# Patient Record
Sex: Male | Born: 1955 | Race: White | Hispanic: No | Marital: Married | Smoking: Never smoker
Health system: Southern US, Community
[De-identification: ages and names within clinical notes are randomized; demographics above are authoritative.]

## PROBLEM LIST (undated history)

## (undated) DIAGNOSIS — D649 Anemia, unspecified: Secondary | ICD-10-CM

## (undated) DIAGNOSIS — F419 Anxiety disorder, unspecified: Secondary | ICD-10-CM

## (undated) DIAGNOSIS — G4730 Sleep apnea, unspecified: Secondary | ICD-10-CM

## (undated) DIAGNOSIS — C801 Malignant (primary) neoplasm, unspecified: Secondary | ICD-10-CM

## (undated) DIAGNOSIS — F319 Bipolar disorder, unspecified: Secondary | ICD-10-CM

## (undated) DIAGNOSIS — I10 Essential (primary) hypertension: Secondary | ICD-10-CM

## (undated) DIAGNOSIS — K219 Gastro-esophageal reflux disease without esophagitis: Secondary | ICD-10-CM

## (undated) HISTORY — PX: VASECTOMY: SHX75

---

## 1974-12-17 HISTORY — PX: TYMPANOPLASTY: SHX33

## 2006-12-17 DIAGNOSIS — F319 Bipolar disorder, unspecified: Secondary | ICD-10-CM

## 2006-12-17 HISTORY — DX: Bipolar disorder, unspecified: F31.9

## 2014-04-06 ENCOUNTER — Other Ambulatory Visit: Payer: Self-pay | Admitting: Urology

## 2014-04-06 NOTE — Progress Notes (Signed)
Surgery on 04/19/14.  Need orders in EPIC.  Thank You.

## 2014-04-07 ENCOUNTER — Encounter (HOSPITAL_COMMUNITY): Payer: Self-pay | Admitting: Pharmacy Technician

## 2014-04-08 ENCOUNTER — Encounter (HOSPITAL_COMMUNITY): Payer: Self-pay

## 2014-04-08 ENCOUNTER — Encounter (HOSPITAL_COMMUNITY)
Admission: RE | Admit: 2014-04-08 | Discharge: 2014-04-08 | Disposition: A | Discharge status: Home / Self Care | Payer: BC Managed Care – PPO | Source: Ambulatory Visit | Attending: Urology | Admitting: Urology

## 2014-04-08 ENCOUNTER — Ambulatory Visit (HOSPITAL_COMMUNITY)
Admission: RE | Admit: 2014-04-08 | Discharge: 2014-04-08 | Disposition: A | Discharge status: Home / Self Care | Payer: BC Managed Care – PPO | Source: Ambulatory Visit | Attending: Anesthesiology | Admitting: Anesthesiology

## 2014-04-08 DIAGNOSIS — I10 Essential (primary) hypertension: Secondary | ICD-10-CM | POA: Insufficient documentation

## 2014-04-08 DIAGNOSIS — Z01812 Encounter for preprocedural laboratory examination: Secondary | ICD-10-CM | POA: Insufficient documentation

## 2014-04-08 DIAGNOSIS — Z01818 Encounter for other preprocedural examination: Secondary | ICD-10-CM | POA: Insufficient documentation

## 2014-04-08 HISTORY — DX: Sleep apnea, unspecified: G47.30

## 2014-04-08 HISTORY — DX: Bipolar disorder, unspecified: F31.9

## 2014-04-08 HISTORY — DX: Malignant (primary) neoplasm, unspecified: C80.1

## 2014-04-08 HISTORY — DX: Anemia, unspecified: D64.9

## 2014-04-08 HISTORY — DX: Essential (primary) hypertension: I10

## 2014-04-08 HISTORY — DX: Gastro-esophageal reflux disease without esophagitis: K21.9

## 2014-04-08 HISTORY — DX: Anxiety disorder, unspecified: F41.9

## 2014-04-08 LAB — CBC
HCT: 39.1 % (ref 39.0–52.0)
Hemoglobin: 14.2 g/dL (ref 13.0–17.0)
MCH: 32.4 pg (ref 26.0–34.0)
MCHC: 36.3 g/dL — ABNORMAL HIGH (ref 30.0–36.0)
MCV: 89.3 fL (ref 78.0–100.0)
PLATELETS: 151 10*3/uL (ref 150–400)
RBC: 4.38 MIL/uL (ref 4.22–5.81)
RDW: 12.9 % (ref 11.5–15.5)
WBC: 6.7 10*3/uL (ref 4.0–10.5)

## 2014-04-08 LAB — BASIC METABOLIC PANEL
BUN: 20 mg/dL (ref 6–23)
CO2: 27 mEq/L (ref 19–32)
Calcium: 9.5 mg/dL (ref 8.4–10.5)
Chloride: 105 mEq/L (ref 96–112)
Creatinine, Ser: 1.26 mg/dL (ref 0.50–1.35)
GFR calc Af Amer: 71 mL/min — ABNORMAL LOW (ref >90)
GFR, EST NON AFRICAN AMERICAN: 61 mL/min — AB (ref >90)
Glucose, Bld: 90 mg/dL (ref 70–99)
Potassium: 4.4 mEq/L (ref 3.7–5.3)
SODIUM: 143 meq/L (ref 137–147)

## 2014-04-08 NOTE — Progress Notes (Signed)
PST VISIT-  Instructions regarding morning of surgery arrival, meds morning of surgery, incentive spirometry info and blood transfusion info were printed x 2 copies- patient received one, one in chart.  I later printed sopa/shoer instructions and the previous instructions cannot be viewed in EPIC.  Pt instructed NPO after MN Sunday night-  Take Propranolol, Bupropion, clonazepam, and omeprazole with sip water

## 2014-04-08 NOTE — Patient Instructions (Addendum)
[place] - Preparing for Surgery Before surgery, you can play an important role.  Because skin is not sterile, your skin needs to be as free of germs as possible.  You can reduce the number of germs on your skin by washing with CHG (chlorahexidine gluconate) soap before surgery.  CHG is an antiseptic cleaner which kills germs and bonds with the skin to continue killing germs even after washing. Please DO NOT use if you have an allergy to CHG or antibacterial soaps.  If your skin becomes reddened/irritated stop using the CHG and inform your nurse when you arrive at Short Stay. Do not shave (including legs and underarms) for at least 48 hours prior to the first CHG shower.  You may shave your face. Please follow these instructions carefully:  1.  Shower with CHG Soap the night before surgery and the  morning of Surgery.  2.  If you choose to wash your hair, wash your hair first as usual with your  normal  shampoo.  3.  After you shampoo, rinse your hair and body thoroughly to remove the  shampoo.                           4.  Use CHG as you would any other liquid soap.  You can apply chg directly  to the skin and wash                       Gently with a scrungie or clean washcloth.  5.  Apply the CHG Soap to your body ONLY FROM THE NECK DOWN.   Do not use on open                           Wound or open sores. Avoid contact with eyes, ears mouth and genitals (private parts).                        Genitals (private parts) with your normal soap.             6.  Wash thoroughly, paying special attention to the area where your surgery  will be performed.  7.  Thoroughly rinse your body with warm water from the neck down.  8.  DO NOT shower/wash with your normal soap after using and rinsing off  the CHG Soap.                9.  Pat yourself dry with a clean towel.            10.  Wear clean pajamas.            11.  Place clean sheets on your bed the night of your first shower and do not  sleep with  pets. Day of Surgery : Do not apply any lotions/deodorants the morning of surgery.  Please wear clean clothes to the hospital/surgery center.  FAILURE TO FOLLOW THESE INSTRUCTIONS MAY RESULT IN THE CANCELLATION OF YOUR SURGERY PATIENT SIGNATURE_________________________________  NURSE SIGNATURE__________________________________

## 2014-04-08 NOTE — Progress Notes (Signed)
ekg 3/15 on chart

## 2014-04-16 NOTE — H&P (Signed)
Chief Complaint Prostate cancer   History of Present Illness Philip Garza is a 58 year old gentleman seen today at the request of Dr. Lerry Liner for clinically localized prostate cancer. He initially underwent prostate biopsy approximately 18 months ago when his PSA was approximately 4. This proved to be benign but his PSA continued to rise to 8.1 despite empiric therapy with antibiotics. This prompted a repeat saturation prostate biopsy in the operating room on 03/10/14 which demonstrated Gleason score 4+3 = 7 adenocarcinoma the prostate. 10 out of 28 biopsy cores were positive for malignancy and disease was noted bilaterally. He apparently has undergone staging studies including a bone scan and CT scan recently. Although I do not have the studies or the reports, the patient states that there was some concern raised about his spine on one of the reports. He has no family history of prostate cancer.    His medical comorbidities included history of bipolar disorder, hypertension, depression, anxiety, and gastroesophageal reflux disease. He also has sleep apnea and does utilize CPAP.    TNM stage: cT2a Nx Mx (He does have induration noted toward the left base medially)  PSA: 8.1  Gleason score: 4+3 = 7  Biopsy (03/10/13 - read by Dr. Hetty Ely, [facility], accession number [id number]): 10/28 cores positive -- 40% of the left-sided specimens were involved with tumor with a Gleason score of 4+3 = 7, 5% of the right-sided specimens were positive for malignancy with a Gleason score 3+4 = 7.  Prostate volume: Unknown    Urinary function: He does have some mild urinary urgency and frequency which is not particularly bothersome. IPSS is 6.  Erectile function: He denies erectile dysfunction. SHIM score is 25.     Past Medical History Problems  1. History of Anxiety (300.00) 2. History of Bipolar disorder (296.80) 3. History of depression (V11.8) 4. History of esophageal reflux  (V12.79) 5. History of gout (V12.29) 6. History of hypertension (V12.59) 7. History of Sleep apnea in adult (327.23)  Surgical History Problems  1. History of Tympanic Membrane Repair - Right Ear  Current Meds 1. ClonazePAM 0.5 MG Oral Tablet;  Therapy: (Recorded:21Apr2015) to Recorded 2. Lisinopril 20 MG Oral Tablet;  Therapy: (Recorded:20Apr2015) to Recorded 3. Omeprazole 20 MG Oral Capsule Delayed Release;  Therapy: [id number] to Recorded 4. Propranolol HCl - 40 MG Oral Tablet;  Therapy: [id number] to Recorded 5. RisperiDONE 0.5 MG Oral Tablet;  Therapy: [id number] to Recorded 6. Wellbutrin XL 300 MG Oral Tablet Extended Release 24 Hour (BuPROPion HCl ER (XL));  Therapy: (Recorded:20Apr2015) to Recorded  Allergies Medication  1. No Known Drug Allergies  Family History Problems  1. Family history of lung cancer (V16.1) : Father 2. Family history of Toxic effect of asbestos : Father  Social History Problems    Denied: History of Alcohol use   Married   Never smoker  Review of Systems Genitourinary, constitutional, skin, eye, otolaryngeal, hematologic/lymphatic, cardiovascular, pulmonary, endocrine, musculoskeletal, gastrointestinal, neurological and psychiatric system(s) were reviewed and pertinent findings if present are noted.  Psychiatric: anxiety and depression.    Vitals Vital Signs [Data Includes: Last 1 Day]  Recorded: 21Apr2015 11:03AM  Height: 5 ft 10 in Weight: 210 lb  BMI Calculated: 30.13 BSA Calculated: 2.13 Blood Pressure: 107 / 67 Heart Rate: 53  Physical Exam Constitutional: Well nourished and well developed . No acute distress.  ENT:. The ears and nose are normal in appearance.  Neck: The appearance of the neck is normal and no neck mass is present.  Pulmonary: No respiratory distress, normal respiratory rhythm and effort and clear bilateral breath sounds.  Cardiovascular: Heart rate and rhythm are normal . No peripheral edema.  Abdomen: The  abdomen is soft and nontender. No masses are palpated. No CVA tenderness. No hernias are palpable. No hepatosplenomegaly noted.  Rectal: Rectal exam demonstrates normal sphincter tone, no tenderness and no masses. Prostate size is estimated to be 40 g. He does have induration noted toward the left medial base of the prostate. The prostate is not tender. The left seminal vesicle is nonpalpable. The right seminal vesicle is nonpalpable. The perineum is normal on inspection.  Lymphatics: The femoral and inguinal nodes are not enlarged or tender.  Skin: Normal skin turgor, no visible rash and no visible skin lesions.  Neuro/Psych:. Mood and affect are appropriate.    Results/Data Urine [Data Includes: Last 1 Day]   21Apr2015  COLOR YELLOW   APPEARANCE CLEAR   SPECIFIC GRAVITY 1.020   pH 5.5   GLUCOSE NEG mg/dL  BILIRUBIN NEG   KETONE NEG mg/dL  BLOOD NEG   PROTEIN NEG mg/dL  UROBILINOGEN 0.2 mg/dL  NITRITE NEG   LEUKOCYTE ESTERASE NEG    I have reviewed his medical records, PSA results, and pathology report. Findings are as dictated above.   Assessment Assessed  1. Prostate cancer (185) 2. History of Bipolar disorder (296.80)  Plan Health Maintenance  1. UA With REFLEX; [Do Not Release]; Status:Complete;   Done: 21Apr2015 10:48AM Prostate cancer  2. Follow-up Schedule Surgery Office  Follow-up  Status: Complete  Done: 21Apr2015 3. PT/OT Referral Referral  Referral  Status: Hold For - PreCert,Date of Service,Physical  Therapy  Requested for: 23Apr2015  Discussion/Summary 1. Prostate cancer: I have had a detailed discussion with Mr. Philip Garza and his wife today. He is a little bit disappointed that surgery will be a bigger deal when he initially expected. However, he does adamantly wish to proceed with surgical therapy despite me offering him the opportunity for a radiation oncology consultation.   The patient was counseled about the natural history of prostate cancer and the standard  treatment options that are available for prostate cancer. It was explained to him how his age and life expectancy, clinical stage, Gleason score, and PSA affect his prognosis, the decision to proceed with additional staging studies, as well as how that information influences recommended treatment strategies. We discussed the roles for active surveillance, radiation therapy, surgical therapy, androgen deprivation, as well as ablative therapy options for the treatment of prostate cancer as appropriate to his individual cancer situation. We discussed the risks and benefits of these options with regard to their impact on cancer control and also in terms of potential adverse events, complications, and impact on quiality of life particularly related to urinary, bowel, and sexual function. The patient was encouraged to ask questions throughout the discussion today and all questions were answered to his stated satisfaction. In addition, the patient was provided with and/or directed to appropriate resources and literature for further education about prostate cancer and treatment options.   We discussed surgical therapy for prostate cancer including the different available surgical approaches. We discussed, in detail, the risks and expectations of surgery with regard to cancer control, urinary control, and erectile function as well as the expected postoperative recovery process. Additional risks of surgery including but not limited to bleeding, infection, hernia formation, nerve damage, lymphocele formation, bowel/rectal injury potentially necessitating colostomy, damage to the urinary tract resulting in urine leakage, urethral stricture, and the  cardiopulmonary risks such as myocardial infarction, stroke, death, venothromboembolism, etc. were explained. The risk of open surgical conversion for robotic/laparoscopic prostatectomy was also discussed.     I will plan to obtain his recent bone scan and CT scan for  evaluation. I also plan to obtain his biopsy operative note to determine his prostate volume.    He will be scheduled for a bilateral nerve sparing robotic-assisted laparoscopic radical prostatectomy and bilateral pelvic lymphadenectomy. Special attention will be paid to the bladder neck area considering the indurated base toward the bladder neck.     Cc: Dr. Lonia Mad  Dr. Alain Marion    SignaturesElectronically signed by : Raynelle Bring, M.D.; Apr 06 2014  4:45PM EST

## 2014-04-18 NOTE — Anesthesia Preprocedure Evaluation (Signed)
Anesthesia Evaluation  Patient identified by MRN, date of birth, ID band Patient awake    Reviewed: Allergy & Precautions, H&P , NPO status , Patient's Chart, lab work & pertinent test results  Airway Mallampati: II TM Distance: >3 FB Neck ROM: Full    Dental  (+) Dental Advisory Given   Pulmonary sleep apnea ,  breath sounds clear to auscultation        Cardiovascular hypertension, Pt. on medications and Pt. on home beta blockers Rhythm:Regular Rate:Normal     Neuro/Psych PSYCHIATRIC DISORDERS Anxiety Bipolar Disorder negative neurological ROS     GI/Hepatic Neg liver ROS, GERD-  Medicated,  Endo/Other  negative endocrine ROS  Renal/GU negative Renal ROS     Musculoskeletal negative musculoskeletal ROS (+)   Abdominal   Peds  Hematology  (+) Blood dyscrasia, anemia ,   Anesthesia Other Findings   Reproductive/Obstetrics                           Anesthesia Physical Anesthesia Plan  ASA: II  Anesthesia Plan: General   Post-op Pain Management:    Induction: Intravenous  Airway Management Planned: Oral ETT  Additional Equipment:   Intra-op Plan:   Post-operative Plan: Extubation in OR  Informed Consent: I have reviewed the patients History and Physical, chart, labs and discussed the procedure including the risks, benefits and alternatives for the proposed anesthesia with the patient or authorized representative who has indicated his/her understanding and acceptance.   Dental advisory given  Plan Discussed with: CRNA  Anesthesia Plan Comments:         Anesthesia Quick Evaluation

## 2014-04-19 ENCOUNTER — Encounter (HOSPITAL_COMMUNITY)
Admission: RE | Disposition: A | Discharge status: Home / Self Care | Payer: Self-pay | Source: Ambulatory Visit | Attending: Urology

## 2014-04-19 ENCOUNTER — Inpatient Hospital Stay (HOSPITAL_COMMUNITY)
Admission: RE | Admit: 2014-04-19 | Discharge: 2014-04-20 | DRG: 708 | Disposition: A | Discharge status: Home / Self Care | Payer: BC Managed Care – PPO | Source: Ambulatory Visit | Attending: Urology | Admitting: Urology

## 2014-04-19 ENCOUNTER — Inpatient Hospital Stay (HOSPITAL_COMMUNITY): Payer: BC Managed Care – PPO | Admitting: Anesthesiology

## 2014-04-19 ENCOUNTER — Encounter (HOSPITAL_COMMUNITY): Payer: Self-pay | Admitting: *Deleted

## 2014-04-19 ENCOUNTER — Encounter (HOSPITAL_COMMUNITY): Payer: BC Managed Care – PPO | Admitting: Anesthesiology

## 2014-04-19 DIAGNOSIS — Z79899 Other long term (current) drug therapy: Secondary | ICD-10-CM

## 2014-04-19 DIAGNOSIS — R3915 Urgency of urination: Secondary | ICD-10-CM | POA: Diagnosis present

## 2014-04-19 DIAGNOSIS — I10 Essential (primary) hypertension: Secondary | ICD-10-CM | POA: Diagnosis present

## 2014-04-19 DIAGNOSIS — F411 Generalized anxiety disorder: Secondary | ICD-10-CM | POA: Diagnosis present

## 2014-04-19 DIAGNOSIS — M109 Gout, unspecified: Secondary | ICD-10-CM | POA: Diagnosis present

## 2014-04-19 DIAGNOSIS — K219 Gastro-esophageal reflux disease without esophagitis: Secondary | ICD-10-CM | POA: Diagnosis present

## 2014-04-19 DIAGNOSIS — R350 Frequency of micturition: Secondary | ICD-10-CM | POA: Diagnosis present

## 2014-04-19 DIAGNOSIS — C61 Malignant neoplasm of prostate: Principal | ICD-10-CM | POA: Diagnosis present

## 2014-04-19 DIAGNOSIS — G4730 Sleep apnea, unspecified: Secondary | ICD-10-CM | POA: Diagnosis present

## 2014-04-19 DIAGNOSIS — F319 Bipolar disorder, unspecified: Secondary | ICD-10-CM | POA: Diagnosis present

## 2014-04-19 DIAGNOSIS — Z801 Family history of malignant neoplasm of trachea, bronchus and lung: Secondary | ICD-10-CM

## 2014-04-19 HISTORY — PX: ROBOT ASSISTED LAPAROSCOPIC RADICAL PROSTATECTOMY: SHX5141

## 2014-04-19 HISTORY — PX: LYMPHADENECTOMY: SHX5960

## 2014-04-19 LAB — TYPE AND SCREEN
ABO/RH(D): O NEG
Antibody Screen: NEGATIVE

## 2014-04-19 LAB — BASIC METABOLIC PANEL
BUN: 17 mg/dL (ref 6–23)
CALCIUM: 7.7 mg/dL — AB (ref 8.4–10.5)
CO2: 26 meq/L (ref 19–32)
Chloride: 105 mEq/L (ref 96–112)
Creatinine, Ser: 1.56 mg/dL — ABNORMAL HIGH (ref 0.50–1.35)
GFR calc Af Amer: 55 mL/min — ABNORMAL LOW (ref >90)
GFR calc non Af Amer: 47 mL/min — ABNORMAL LOW (ref >90)
Glucose, Bld: 226 mg/dL — ABNORMAL HIGH (ref 70–99)
POTASSIUM: 5.3 meq/L (ref 3.7–5.3)
SODIUM: 139 meq/L (ref 137–147)

## 2014-04-19 LAB — HEMOGLOBIN AND HEMATOCRIT, BLOOD
HCT: 40.2 % (ref 39.0–52.0)
Hemoglobin: 14.1 g/dL (ref 13.0–17.0)

## 2014-04-19 LAB — ABO/RH: ABO/RH(D): O NEG

## 2014-04-19 SURGERY — ROBOTIC ASSISTED LAPAROSCOPIC RADICAL PROSTATECTOMY LEVEL 2
Anesthesia: General

## 2014-04-19 MED ORDER — LACTATED RINGERS IV SOLN
INTRAVENOUS | Status: DC | PRN
  Administered 2014-04-19 (×3): via INTRAVENOUS

## 2014-04-19 MED ORDER — PROPOFOL 10 MG/ML IV BOLUS
INTRAVENOUS | Status: DC | PRN
  Administered 2014-04-19: 180 mg via INTRAVENOUS

## 2014-04-19 MED ORDER — SUFENTANIL CITRATE 50 MCG/ML IV SOLN
INTRAVENOUS | Status: AC
Start: 2014-04-19 — End: 2014-04-19
  Filled 2014-04-19: qty 1

## 2014-04-19 MED ORDER — MIDAZOLAM HCL 5 MG/5ML IJ SOLN
INTRAMUSCULAR | Status: DC | PRN
  Administered 2014-04-19: 2 mg via INTRAVENOUS

## 2014-04-19 MED ORDER — PROMETHAZINE HCL 25 MG/ML IJ SOLN
INTRAMUSCULAR | Status: AC
  Filled 2014-04-19: qty 1

## 2014-04-19 MED ORDER — CEFAZOLIN SODIUM 1-5 GM-% IV SOLN
1.0000 g | Freq: Three times a day (TID) | INTRAVENOUS | Status: AC
  Administered 2014-04-19 (×2): 1 g via INTRAVENOUS
  Filled 2014-04-19 (×2): qty 50

## 2014-04-19 MED ORDER — DOCUSATE SODIUM 100 MG PO CAPS
100.0000 mg | ORAL_CAPSULE | Freq: Two times a day (BID) | ORAL | Status: DC
  Administered 2014-04-19 – 2014-04-20 (×2): 100 mg via ORAL
  Filled 2014-04-19 (×3): qty 1

## 2014-04-19 MED ORDER — PROMETHAZINE HCL 25 MG/ML IJ SOLN
6.2500 mg | INTRAMUSCULAR | Status: DC | PRN
  Administered 2014-04-19: 6.25 mg via INTRAVENOUS

## 2014-04-19 MED ORDER — NEOSTIGMINE METHYLSULFATE 10 MG/10ML IV SOLN
INTRAVENOUS | Status: DC | PRN
  Administered 2014-04-19: 4 mg via INTRAVENOUS

## 2014-04-19 MED ORDER — HYDROMORPHONE HCL PF 2 MG/ML IJ SOLN
INTRAMUSCULAR | Status: AC
  Filled 2014-04-19: qty 1

## 2014-04-19 MED ORDER — DIPHENHYDRAMINE HCL 50 MG/ML IJ SOLN: 12.5000 mg | Freq: Four times a day (QID) | INTRAMUSCULAR | Status: DC | PRN

## 2014-04-19 MED ORDER — MIDAZOLAM HCL 2 MG/2ML IJ SOLN
INTRAMUSCULAR | Status: AC
  Filled 2014-04-19: qty 2

## 2014-04-19 MED ORDER — GLYCOPYRROLATE 0.2 MG/ML IJ SOLN
INTRAMUSCULAR | Status: DC | PRN
  Administered 2014-04-19: 0.6 mg via INTRAVENOUS

## 2014-04-19 MED ORDER — EPHEDRINE SULFATE 50 MG/ML IJ SOLN
INTRAMUSCULAR | Status: DC | PRN
  Administered 2014-04-19: 5 mg via INTRAVENOUS

## 2014-04-19 MED ORDER — PROPOFOL 10 MG/ML IV BOLUS
INTRAVENOUS | Status: AC
  Filled 2014-04-19: qty 20

## 2014-04-19 MED ORDER — CISATRACURIUM BESYLATE 20 MG/10ML IV SOLN
INTRAVENOUS | Status: AC
  Filled 2014-04-19: qty 10

## 2014-04-19 MED ORDER — PROPRANOLOL HCL 80 MG PO TABS
80.0000 mg | ORAL_TABLET | Freq: Two times a day (BID) | ORAL | Status: DC
  Filled 2014-04-19: qty 1

## 2014-04-19 MED ORDER — DIPHENHYDRAMINE HCL 12.5 MG/5ML PO ELIX: 12.5000 mg | ORAL_SOLUTION | Freq: Four times a day (QID) | ORAL | Status: DC | PRN

## 2014-04-19 MED ORDER — LACTATED RINGERS IV SOLN
INTRAVENOUS | Status: DC | PRN
  Administered 2014-04-19: 08:00:00

## 2014-04-19 MED ORDER — GLYCOPYRROLATE 0.2 MG/ML IJ SOLN
INTRAMUSCULAR | Status: AC
  Filled 2014-04-19: qty 3

## 2014-04-19 MED ORDER — SUFENTANIL CITRATE 50 MCG/ML IV SOLN
INTRAVENOUS | Status: DC | PRN
  Administered 2014-04-19: 5 ug via INTRAVENOUS
  Administered 2014-04-19: 20 ug via INTRAVENOUS

## 2014-04-19 MED ORDER — HYDROMORPHONE HCL PF 1 MG/ML IJ SOLN
INTRAMUSCULAR | Status: DC | PRN
  Administered 2014-04-19 (×3): .4 mg via INTRAVENOUS

## 2014-04-19 MED ORDER — SODIUM CHLORIDE 0.9 % IV BOLUS (SEPSIS)
1000.0000 mL | Freq: Once | INTRAVENOUS | Status: AC
  Administered 2014-04-19: 1000 mL via INTRAVENOUS

## 2014-04-19 MED ORDER — SODIUM CHLORIDE 0.9 % IR SOLN
Status: DC | PRN
  Administered 2014-04-19: 1000 mL

## 2014-04-19 MED ORDER — BUPIVACAINE-EPINEPHRINE (PF) 0.25% -1:200000 IJ SOLN
INTRAMUSCULAR | Status: AC
  Filled 2014-04-19: qty 30

## 2014-04-19 MED ORDER — BUPROPION HCL ER (XL) 300 MG PO TB24
300.0000 mg | ORAL_TABLET | Freq: Every morning | ORAL | Status: DC
  Administered 2014-04-20: 300 mg via ORAL
  Filled 2014-04-19: qty 1

## 2014-04-19 MED ORDER — HYDROMORPHONE HCL PF 1 MG/ML IJ SOLN
0.2500 mg | INTRAMUSCULAR | Status: DC | PRN
  Administered 2014-04-19 (×3): 0.5 mg via INTRAVENOUS

## 2014-04-19 MED ORDER — OXYCODONE HCL 5 MG PO TABS: 5.0000 mg | ORAL_TABLET | Freq: Once | ORAL | Status: DC | PRN

## 2014-04-19 MED ORDER — EPHEDRINE SULFATE 50 MG/ML IJ SOLN
INTRAMUSCULAR | Status: AC
  Filled 2014-04-19: qty 1

## 2014-04-19 MED ORDER — BUPIVACAINE-EPINEPHRINE 0.25% -1:200000 IJ SOLN
INTRAMUSCULAR | Status: DC | PRN
Start: 2014-04-19 — End: 2014-04-19
  Administered 2014-04-19: 30 mL

## 2014-04-19 MED ORDER — OXYCODONE HCL 5 MG/5ML PO SOLN
5.0000 mg | Freq: Once | ORAL | Status: DC | PRN
  Filled 2014-04-19: qty 5

## 2014-04-19 MED ORDER — SUCCINYLCHOLINE CHLORIDE 20 MG/ML IJ SOLN
INTRAMUSCULAR | Status: DC | PRN
  Administered 2014-04-19: 100 mg via INTRAVENOUS

## 2014-04-19 MED ORDER — LIDOCAINE HCL (CARDIAC) 20 MG/ML IV SOLN
INTRAVENOUS | Status: AC
  Filled 2014-04-19: qty 5

## 2014-04-19 MED ORDER — ACETAMINOPHEN 325 MG PO TABS: 650.0000 mg | ORAL_TABLET | ORAL | Status: DC | PRN

## 2014-04-19 MED ORDER — PANTOPRAZOLE SODIUM 40 MG PO TBEC
40.0000 mg | DELAYED_RELEASE_TABLET | Freq: Every day | ORAL | Status: DC
  Administered 2014-04-20: 40 mg via ORAL
  Filled 2014-04-19: qty 1

## 2014-04-19 MED ORDER — KCL IN DEXTROSE-NACL 20-5-0.45 MEQ/L-%-% IV SOLN
INTRAVENOUS | Status: DC
  Administered 2014-04-19: 12:00:00 via INTRAVENOUS
  Administered 2014-04-19: 150 mL/h via INTRAVENOUS
  Administered 2014-04-20: 02:00:00 via INTRAVENOUS
  Filled 2014-04-19 (×4): qty 1000

## 2014-04-19 MED ORDER — HYDROMORPHONE HCL PF 1 MG/ML IJ SOLN
INTRAMUSCULAR | Status: AC
  Filled 2014-04-19: qty 1

## 2014-04-19 MED ORDER — MEPERIDINE HCL 50 MG/ML IJ SOLN: 6.2500 mg | INTRAMUSCULAR | Status: DC | PRN

## 2014-04-19 MED ORDER — MORPHINE SULFATE 10 MG/ML IJ SOLN: 2.0000 mg | INTRAMUSCULAR | Status: DC | PRN

## 2014-04-19 MED ORDER — DEXAMETHASONE SODIUM PHOSPHATE 10 MG/ML IJ SOLN
INTRAMUSCULAR | Status: AC
  Filled 2014-04-19: qty 1

## 2014-04-19 MED ORDER — LIDOCAINE HCL (CARDIAC) 20 MG/ML IV SOLN
INTRAVENOUS | Status: DC | PRN
  Administered 2014-04-19: 100 mg via INTRAVENOUS

## 2014-04-19 MED ORDER — KETOROLAC TROMETHAMINE 15 MG/ML IJ SOLN
15.0000 mg | Freq: Four times a day (QID) | INTRAMUSCULAR | Status: DC
  Administered 2014-04-19 – 2014-04-20 (×4): 15 mg via INTRAVENOUS
  Filled 2014-04-19 (×5): qty 1

## 2014-04-19 MED ORDER — HEPARIN SODIUM (PORCINE) 1000 UNIT/ML IJ SOLN
INTRAMUSCULAR | Status: AC
  Filled 2014-04-19: qty 1

## 2014-04-19 MED ORDER — KCL IN DEXTROSE-NACL 20-5-0.45 MEQ/L-%-% IV SOLN
INTRAVENOUS | Status: AC
  Filled 2014-04-19: qty 1000

## 2014-04-19 MED ORDER — HYDROCODONE-ACETAMINOPHEN 5-325 MG PO TABS: 1.0000 | ORAL_TABLET | Freq: Four times a day (QID) | ORAL | Status: DC | PRN

## 2014-04-19 MED ORDER — ONDANSETRON HCL 4 MG/2ML IJ SOLN
INTRAMUSCULAR | Status: AC
  Filled 2014-04-19: qty 2

## 2014-04-19 MED ORDER — ONDANSETRON HCL 4 MG/2ML IJ SOLN
INTRAMUSCULAR | Status: DC | PRN
  Administered 2014-04-19: 4 mg via INTRAVENOUS

## 2014-04-19 MED ORDER — CIPROFLOXACIN HCL 500 MG PO TABS: 500.0000 mg | ORAL_TABLET | Freq: Two times a day (BID) | ORAL | Status: DC

## 2014-04-19 MED ORDER — DEXAMETHASONE SODIUM PHOSPHATE 10 MG/ML IJ SOLN
INTRAMUSCULAR | Status: DC | PRN
  Administered 2014-04-19: 10 mg via INTRAVENOUS

## 2014-04-19 MED ORDER — ATROPINE SULFATE 0.4 MG/ML IJ SOLN
INTRAMUSCULAR | Status: AC
  Filled 2014-04-19: qty 1

## 2014-04-19 MED ORDER — RISPERIDONE 1 MG PO TABS
1.0000 mg | ORAL_TABLET | Freq: Every day | ORAL | Status: DC
  Administered 2014-04-19: 1 mg via ORAL
  Filled 2014-04-19 (×2): qty 1

## 2014-04-19 MED ORDER — SODIUM CHLORIDE 0.9 % IJ SOLN
INTRAMUSCULAR | Status: AC
  Filled 2014-04-19: qty 10

## 2014-04-19 MED ORDER — CISATRACURIUM BESYLATE (PF) 10 MG/5ML IV SOLN
INTRAVENOUS | Status: DC | PRN
  Administered 2014-04-19: 4 mg via INTRAVENOUS
  Administered 2014-04-19: 10 mg via INTRAVENOUS
  Administered 2014-04-19: 2 mg via INTRAVENOUS

## 2014-04-19 MED ORDER — CEFAZOLIN SODIUM-DEXTROSE 2-3 GM-% IV SOLR
INTRAVENOUS | Status: AC
Start: 2014-04-19 — End: 2014-04-19
  Filled 2014-04-19: qty 50

## 2014-04-19 MED ORDER — KETOROLAC TROMETHAMINE 15 MG/ML IJ SOLN
INTRAMUSCULAR | Status: AC
  Filled 2014-04-19: qty 1

## 2014-04-19 MED ORDER — SODIUM CHLORIDE 0.9 % IV BOLUS (SEPSIS)
500.0000 mL | Freq: Once | INTRAVENOUS | Status: AC
  Administered 2014-04-19: 13:00:00 via INTRAVENOUS

## 2014-04-19 MED ORDER — LISINOPRIL 20 MG PO TABS
20.0000 mg | ORAL_TABLET | Freq: Every morning | ORAL | Status: DC
  Administered 2014-04-19 – 2014-04-20 (×2): 20 mg via ORAL
  Filled 2014-04-19 (×2): qty 1

## 2014-04-19 MED ORDER — CLONAZEPAM 0.5 MG PO TABS
0.5000 mg | ORAL_TABLET | Freq: Every morning | ORAL | Status: DC
  Administered 2014-04-20: 0.5 mg via ORAL
  Filled 2014-04-19: qty 1

## 2014-04-19 MED ORDER — CEFAZOLIN SODIUM-DEXTROSE 2-3 GM-% IV SOLR
2.0000 g | INTRAVENOUS | Status: AC
  Administered 2014-04-19: 2 g via INTRAVENOUS

## 2014-04-19 MED ORDER — PROPRANOLOL HCL 40 MG PO TABS
80.0000 mg | ORAL_TABLET | Freq: Two times a day (BID) | ORAL | Status: DC
  Administered 2014-04-19 – 2014-04-20 (×2): 80 mg via ORAL
  Filled 2014-04-19 (×3): qty 2

## 2014-04-19 SURGICAL SUPPLY — 44 items
CABLE HIGH FREQUENCY MONO STRZ (ELECTRODE) ×4 IMPLANT
CATH FOLEY 2WAY SLVR 18FR 30CC (CATHETERS) ×4 IMPLANT
CATH ROBINSON RED A/P 16FR (CATHETERS) ×4 IMPLANT
CATH ROBINSON RED A/P 8FR (CATHETERS) ×4 IMPLANT
CATH TIEMANN FOLEY 18FR 5CC (CATHETERS) ×4 IMPLANT
CHLORAPREP W/TINT 26ML (MISCELLANEOUS) ×4 IMPLANT
CLIP LIGATING HEM O LOK PURPLE (MISCELLANEOUS) ×12 IMPLANT
CLOTH BEACON ORANGE TIMEOUT ST (SAFETY) ×4 IMPLANT
COVER SURGICAL LIGHT HANDLE (MISCELLANEOUS) ×4 IMPLANT
COVER TIP SHEARS 8 DVNC (MISCELLANEOUS) ×2 IMPLANT
COVER TIP SHEARS 8MM DA VINCI (MISCELLANEOUS) ×2
CUTTER ECHEON FLEX ENDO 45 340 (ENDOMECHANICALS) ×4 IMPLANT
DECANTER SPIKE VIAL GLASS SM (MISCELLANEOUS) IMPLANT
DERMABOND ADVANCED (GAUZE/BANDAGES/DRESSINGS)
DERMABOND ADVANCED .7 DNX12 (GAUZE/BANDAGES/DRESSINGS) IMPLANT
DRAPE SURG IRRIG POUCH 19X23 (DRAPES) ×4 IMPLANT
DRSG TEGADERM 4X4.75 (GAUZE/BANDAGES/DRESSINGS) ×4 IMPLANT
DRSG TEGADERM 6X8 (GAUZE/BANDAGES/DRESSINGS) ×8 IMPLANT
ELECT REM PT RETURN 9FT ADLT (ELECTROSURGICAL) ×4
ELECTRODE REM PT RTRN 9FT ADLT (ELECTROSURGICAL) ×2 IMPLANT
GLOVE BIO SURGEON STRL SZ 6.5 (GLOVE) ×3 IMPLANT
GLOVE BIO SURGEONS STRL SZ 6.5 (GLOVE) ×1
GLOVE BIOGEL M STRL SZ7.5 (GLOVE) ×36 IMPLANT
GOWN STRL REUS W/TWL LRG LVL3 (GOWN DISPOSABLE) ×20 IMPLANT
HOLDER FOLEY CATH W/STRAP (MISCELLANEOUS) ×4 IMPLANT
IV LACTATED RINGERS 1000ML (IV SOLUTION) ×4 IMPLANT
KIT ACCESSORY DA VINCI DISP (KITS) ×2
KIT ACCESSORY DVNC DISP (KITS) ×2 IMPLANT
MANIFOLD NEPTUNE II (INSTRUMENTS) ×4 IMPLANT
NDL SAFETY ECLIPSE 18X1.5 (NEEDLE) ×2 IMPLANT
NEEDLE HYPO 18GX1.5 SHARP (NEEDLE) ×2
PACK ROBOT UROLOGY CUSTOM (CUSTOM PROCEDURE TRAY) ×4 IMPLANT
RELOAD GREEN ECHELON 45 (STAPLE) ×4 IMPLANT
SET TUBE IRRIG SUCTION NO TIP (IRRIGATION / IRRIGATOR) ×4 IMPLANT
SOLUTION ELECTROLUBE (MISCELLANEOUS) ×4 IMPLANT
SUT ETHILON 3 0 PS 1 (SUTURE) ×4 IMPLANT
SUT MNCRL 3 0 RB1 (SUTURE) ×2 IMPLANT
SUT MNCRL AB 4-0 PS2 18 (SUTURE) ×8 IMPLANT
SUT MONOCRYL 3 0 RB1 (SUTURE) ×2
SUT VICRYL 0 UR6 27IN ABS (SUTURE) ×8 IMPLANT
SYR 27GX1/2 1ML LL SAFETY (SYRINGE) ×4 IMPLANT
TOWEL OR 17X26 10 PK STRL BLUE (TOWEL DISPOSABLE) ×4 IMPLANT
TOWEL OR NON WOVEN STRL DISP B (DISPOSABLE) ×4 IMPLANT
WATER STERILE IRR 1500ML POUR (IV SOLUTION) ×8 IMPLANT

## 2014-04-19 NOTE — Anesthesia Procedure Notes (Addendum)
Procedure Name: Intubation Date/Time: 04/19/2014 7:27 AM Performed by: Sharlette Dense Pre-anesthesia Checklist: Patient identified Patient Re-evaluated:Patient Re-evaluated prior to inductionOxygen Delivery Method: Circle system utilized Preoxygenation: Pre-oxygenation with 100% oxygen Intubation Type: IV induction Ventilation: Mask ventilation without difficulty and Oral airway inserted - appropriate to patient size Laryngoscope Size: Mac and 3 Grade View: Grade I Tube type: Oral Tube size: 8.0 mm Number of attempts: 1 Airway Equipment and Method: Stylet and Oral airway Placement Confirmation: ETT inserted through vocal cords under direct vision,  positive ETCO2 and breath sounds checked- equal and bilateral Secured at: 24 cm Tube secured with: Tape Dental Injury: Teeth and Oropharynx as per pre-operative assessment  Comments: Intubation performed by [facility]

## 2014-04-19 NOTE — Discharge Instructions (Signed)
1. Activity:  You are encouraged to ambulate frequently (about every hour during waking hours) to help prevent blood clots from forming in your legs or lungs.  However, you should not engage in any heavy lifting (> 10-15 lbs), strenuous activity, or straining. 2. Diet: You should continue a clear liquid diet until passing gas from below.  Once this occurs, you may advance your diet to a soft diet that would be easy to digest (i.e soups, scrambled eggs, mashed potatoes, etc.) for 24 hours just as you would if getting over a bad stomach flu.  If tolerating this diet well for 24 hours, you may then begin eating regular food.  It will be normal to have some amount of bloating, nausea, and abdominal discomfort intermittently. 3. Prescriptions:  You will be provided a prescription for pain medication to take as needed.  If your pain is not severe enough to require the prescription pain medication, you may take extra strength Tylenol instead.  You should also take an over the counter stool softener (Colace 100 mg twice daily) to avoid straining with bowel movements as the pain medication may constipate you. Finally, you will also be provided a prescription for an antibiotic to begin the day prior to your return visit in the office for catheter removal. 4. Catheter care: You will be taught how to take care of the catheter by the nursing staff prior to discharge from the hospital.  You may use both a leg bag and the larger bedside bag but it is recommended to at least use the bigger bedside bag at nighttime as the leg bag is small and will fill up overnight and also does not drain as well when lying flat. You may periodically feel a strong urge to void with the catheter in place.  This is a bladder spasm and most often can occur when having a bowel movement or when you are moving around. It is typically self-limited and usually will stop after a few minutes.  You may use some Vaseline or Neosporin around the tip of the  catheter to reduce friction at the tip of the penis. 5. Incisions: You may remove your dressing bandages the 2nd day after surgery.  You most likely will have a few small staples in each of the incisions and once the bandages are removed, the incisions may stay open to air.  You may start showering (not soaking or bathing in water) 48 hours after surgery and the incisions simply need to be patted dry after the shower.  No additional care is needed. 6. What to call us about: You should call the office [phone]) if you develop fever > 101, persistent vomiting, or the catheter stops draining. Also, feel free to call with any other questions you may have and remember the handout that was provided to you as a reference preoperatively which answers many of the common questions that arise after surgery.  You may resume aspirin, vitamins, aleve, advil, and supplements 7 days after surgery.

## 2014-04-19 NOTE — Interval H&P Note (Signed)
History and Physical Interval Note:  04/19/2014 7:10 AM  Philip Garza  has presented today for surgery, with the diagnosis of PROSTATE CANCER  The various methods of treatment have been discussed with the patient and family. After consideration of risks, benefits and other options for treatment, the patient has consented to  Procedure(s): ROBOTIC ASSISTED LAPAROSCOPIC RADICAL PROSTATECTOMY LEVEL 2 (N/A) LYMPHADENECTOMY (Bilateral) as a surgical intervention .  The patient's history has been reviewed, patient examined, no change in status, stable for surgery.  I have reviewed the patient's chart and labs.  Questions were answered to the patient's satisfaction.     [employer]

## 2014-04-19 NOTE — Transfer of Care (Signed)
Immediate Anesthesia Transfer of Care Note  Patient: Philip Garza  Procedure(s) Performed: Procedure(s): ROBOTIC ASSISTED LAPAROSCOPIC RADICAL PROSTATECTOMY LEVEL 2 (N/A) LYMPHADENECTOMY (Bilateral)  Patient Location: PACU  Anesthesia Type:General  Level of Consciousness: sedated  Airway & Oxygen Therapy: Patient Spontanous Breathing and Patient connected to face mask oxygen  Post-op Assessment: Report given to PACU RN and Post -op Vital signs reviewed and stable  Post vital signs: Reviewed and stable  Complications: No apparent anesthesia complications

## 2014-04-19 NOTE — Progress Notes (Signed)
Pt will place on mask when ready for bed. Pt and pt's family encouraged to call RT if needing any assistance with placing mask on. No distress noted.

## 2014-04-19 NOTE — Op Note (Signed)
Preoperative diagnosis: Clinically localized adenocarcinoma of the prostate (clinical stage T2a N0 M0)  Postoperative diagnosis: Clinically localized adenocarcinoma of the prostate (clinical stage T2a N0 M0)  Procedure:  1. Robotic assisted laparoscopic radical prostatectomy (right nerve sparing) 2. Bilateral robotic assisted laparoscopic pelvic lymphadenectomy  Surgeon: [name]. M.D.  Assistant(s): Leta Baptist, PA-C  Anesthesia: General  Complications: None  EBL: 50 mL  IVF:  2000 mL crystalloid  Specimens: 1. Prostate and seminal vesicles 2. Right pelvic lymph nodes 3. Left pelvic lymph nodes  Disposition of specimens: Pathology  Drains: 1. 20 Fr coude catheter 2. # 19 Blake pelvic drain  Indication: Philip Garza is a 58 y.o. patient with clinically localized prostate cancer.  After a thorough review of the management options for treatment of prostate cancer, he elected to proceed with surgical therapy and the above procedure(s).  We have discussed the potential benefits and risks of the procedure, side effects of the proposed treatment, the likelihood of the patient achieving the goals of the procedure, and any potential problems that might occur during the procedure or recuperation. Informed consent has been obtained.  Description of procedure:  The patient was taken to the operating room and a general anesthetic was administered. He was given preoperative antibiotics, placed in the dorsal lithotomy position, and prepped and draped in the usual sterile fashion. Next a preoperative timeout was performed. A urethral catheter was placed into the bladder and a site was selected near the umbilicus for placement of the camera port. This was placed using a standard open Hassan technique which allowed entry into the peritoneal cavity under direct vision and without difficulty. A 12 mm port was placed and a pneumoperitoneum established. The camera was then used to  inspect the abdomen and there was no evidence of any intra-abdominal injuries or other abnormalities. The remaining abdominal ports were then placed. 8 mm robotic ports were placed in the right lower quadrant, left lower quadrant, and far left lateral abdominal wall. A 5 mm port was placed in the right upper quadrant and a 12 mm port was placed in the right lateral abdominal wall for laparoscopic assistance. All ports were placed under direct vision without difficulty. The surgical cart was then docked.   Utilizing the cautery scissors, the bladder was reflected posteriorly allowing entry into the space of Retzius and identification of the endopelvic fascia and prostate. The periprostatic fat was then removed from the prostate allowing full exposure of the endopelvic fascia. The endopelvic fascia was then incised from the apex back to the base of the prostate bilaterally and the underlying levator muscle fibers were swept laterally off the prostate thereby isolating the dorsal venous complex. The dorsal vein was then stapled and divided with a 45 mm Flex Echelon stapler. Attention then turned to the bladder neck which was divided anteriorly thereby allowing entry into the bladder and exposure of the urethral catheter. The catheter balloon was deflated and the catheter was brought into the operative field and used to retract the prostate anteriorly. The posterior bladder neck was then examined and was divided allowing further dissection between the bladder and prostate posteriorly until the vasa deferentia and seminal vessels were identified. The vasa deferentia were isolated, divided, and lifted anteriorly. The seminal vesicles were dissected down to their tips with care to control the seminal vascular arterial blood supply. These structures were then lifted anteriorly and the space between Denonvillier's fascia and the anterior rectum was developed with a combination of sharp  and blunt dissection. This isolated  the vascular pedicles of the prostate.  The lateral prostatic fascia on the right side of the prostate was then sharply incised allowing release of the neurovascular bundle. The vascular pedicle of the prostate on the right side was then ligated with Weck clips between the prostate and neurovascular bundle and divided with sharp cold scissor dissection resulting in neurovascular bundle preservation. On the left side, a wide non nerve sparing dissection was performed with Weck clips used to ligate the vascular pedicle of the prostate. The neurovascular bundle on the right side was then separated off the apex of the prostate and urethra.  The urethra was then sharply transected allowing the prostate specimen to be disarticulated. The pelvis was copiously irrigated and hemostasis was ensured. There was no evidence for rectal injury.  Attention then turned to the right pelvic sidewall. The fibrofatty tissue between the external iliac vein, confluence of the iliac vessels, hypogastric artery, and Cooper's ligament was dissected free from the pelvic sidewall with care to preserve the obturator nerve. Weck clips were used for lymphostasis and hemostasis. An identical procedure was performed on the contralateral side and the lymphatic packets were removed for permanent pathologic analysis.  Attention then turned to the urethral anastomosis. A 2-0 Vicryl slip knot was placed between Denonvillier's fascia, the posterior bladder neck, and the posterior urethra to reapproximate these structures. A double-armed 3-0 Monocryl suture was then used to perform a 360 running tension-free anastomosis between the bladder neck and urethra. A new urethral catheter was then placed into the bladder and irrigated. There were no blood clots within the bladder and the anastomosis appeared to be watertight. A #19 Blake drain was then brought through the left lateral 8 mm port site and positioned appropriately within the pelvis. It was  secured to the skin with a nylon suture. The surgical cart was then undocked. The right lateral 12 mm port site was closed at the fascial level with a 0 Vicryl suture placed laparoscopically. All remaining ports were then removed under direct vision. The prostate specimen was removed intact within the Endopouch retrieval bag via the periumbilical camera port site. This fascial opening was closed with two running 0 Vicryl sutures. 0.25% Marcaine was then injected into all port sites and all incisions were reapproximated at the skin level with 4-0 Monocryl subcuticular sutures. Dermabond was applied. The patient appeared to tolerate the procedure well and without complications. The patient was able to be extubated and transferred to the recovery unit in satisfactory condition.   [name] MD

## 2014-04-19 NOTE — Progress Notes (Signed)
Patient ID: Philip Garza, male   DOB: [birth year], 58 y.o.   MRN: [id number]  Post-op note  Subjective: The patient is doing well.  No complaints. Catheter was not draining in PACU but is now draining well since catheter was repositioned (ureteral orifices were close to bladder neck).  Objective: Vital signs in last 24 hours: Temp:  [97.6 F (36.4 C)-98.5 F (36.9 C)] 98.5 F (36.9 C) (05/04 1417) Pulse Rate:  [54-70] 61 (05/04 1417) Resp:  [9-24] 16 (05/04 1417) BP: (107-139)/(59-89) 128/77 mmHg (05/04 1417) SpO2:  [91 %-100 %] 98 % (05/04 1417) Weight:  [97.07 kg (214 lb)] 97.07 kg (214 lb) (05/04 1417)  Intake/Output from previous day:   Intake/Output this shift: Total I/O In: 6160 [I.V.:3300; Other:110; IV Piggyback:2750] Out: 46 [Urine:140; Drains:230; Blood:50]  Physical Exam:  General: Alert and oriented. Abdomen: Soft, Nondistended. Incisions: Clean and dry. GU: Urine draining well and clear (about 200 cc in bag)  Lab Results:  Recent Labs  04/19/14 1040  HGB 14.1  HCT 40.2    Assessment/Plan: POD#0   1) Continue to monitor UOP 2) Ambulate, IS   [name]. MD   LOS: 0 days   [name] 04/19/2014, 4:24 PM

## 2014-04-19 NOTE — Anesthesia Postprocedure Evaluation (Signed)
Anesthesia Post Note  Patient: Philip Garza  Procedure(s) Performed: Procedure(s) (LRB): ROBOTIC ASSISTED LAPAROSCOPIC RADICAL PROSTATECTOMY LEVEL 2 (N/A) LYMPHADENECTOMY (Bilateral)  Anesthesia type: General  Patient location: PACU  Post pain: Pain level controlled  Post assessment: Post-op Vital signs reviewed  Last Vitals: BP 116/71  Pulse 59  Temp(Src) 36.6 C (Oral)  Resp 20  SpO2 93%  Post vital signs: Reviewed  Level of consciousness: sedated  Complications: No apparent anesthesia complications

## 2014-04-20 ENCOUNTER — Encounter (HOSPITAL_COMMUNITY): Payer: Self-pay | Admitting: Urology

## 2014-04-20 LAB — BASIC METABOLIC PANEL
BUN: 15 mg/dL (ref 6–23)
CHLORIDE: 107 meq/L (ref 96–112)
CO2: 22 mEq/L (ref 19–32)
CREATININE: 1.45 mg/dL — AB (ref 0.50–1.35)
Calcium: 7.9 mg/dL — ABNORMAL LOW (ref 8.4–10.5)
GFR calc Af Amer: 60 mL/min — ABNORMAL LOW (ref >90)
GFR calc non Af Amer: 52 mL/min — ABNORMAL LOW (ref >90)
Glucose, Bld: 157 mg/dL — ABNORMAL HIGH (ref 70–99)
Potassium: 4.7 mEq/L (ref 3.7–5.3)
Sodium: 140 mEq/L (ref 137–147)

## 2014-04-20 LAB — HEMOGLOBIN AND HEMATOCRIT, BLOOD
HCT: 34.1 % — ABNORMAL LOW (ref 39.0–52.0)
HEMOGLOBIN: 12 g/dL — AB (ref 13.0–17.0)

## 2014-04-20 MED ORDER — HYDROCODONE-ACETAMINOPHEN 5-325 MG PO TABS
1.0000 | ORAL_TABLET | Freq: Four times a day (QID) | ORAL | Status: DC | PRN
  Administered 2014-04-20: 2 via ORAL
  Filled 2014-04-20: qty 2

## 2014-04-20 MED ORDER — BISACODYL 10 MG RE SUPP
10.0000 mg | Freq: Once | RECTAL | Status: AC
  Administered 2014-04-20: 10 mg via RECTAL
  Filled 2014-04-20: qty 1

## 2014-04-20 NOTE — Discharge Summary (Signed)
  Date of admission: 04/19/2014  Date of discharge: 04/20/2014  Admission diagnosis: Prostate Cancer  Discharge diagnosis: Prostate Cancer  History and Physical: For full details, please see admission history and physical. Briefly, Philip Garza is a 58 y.o. gentleman with localized prostate cancer.  After discussing management/treatment options, he elected to proceed with surgical treatment.  Hospital Course: GARREN GREENMAN was taken to the operating room on 04/19/2014 and underwent a robotic assisted laparoscopic radical prostatectomy. He tolerated this procedure well and without complications. Postoperatively, he was able to be transferred to a regular hospital room following recovery from anesthesia.  He was able to begin ambulating the night of surgery. He remained hemodynamically stable overnight.  He had excellent urine output with appropriately minimal output from his pelvic drain and his pelvic drain was removed on POD #1.  He was transitioned to oral pain medication, tolerated a clear liquid diet, and had met all discharge criteria and was able to be discharged home later on POD#1.  Laboratory values:  Recent Labs  04/19/14 1040 04/20/14 0333  HGB 14.1 12.0*  HCT 40.2 34.1*    Disposition: Home  Discharge instruction: He was instructed to be ambulatory but to refrain from heavy lifting, strenuous activity, or driving. He was instructed on urethral catheter care.  Discharge medications:     Medication List    STOP taking these medications       B-complex with vitamin C tablet      TAKE these medications       buPROPion 300 MG 24 hr tablet  Commonly known as:  WELLBUTRIN XL  Take 300 mg by mouth every morning.     ciprofloxacin 500 MG tablet  Commonly known as:  CIPRO  Take 1 tablet (500 mg total) by mouth 2 (two) times daily. Start day prior to office visit for foley removal     clonazePAM 0.5 MG tablet  Commonly known as:  KLONOPIN  Take 0.5 mg by mouth every morning.      ferrous sulfate 325 (65 FE) MG tablet  Take 325 mg by mouth daily with breakfast.     HYDROcodone-acetaminophen 5-325 MG per tablet  Commonly known as:  NORCO  Take 1-2 tablets by mouth every 6 (six) hours as needed.     lisinopril 20 MG tablet  Commonly known as:  PRINIVIL,ZESTRIL  Take 20 mg by mouth every morning.     omeprazole 20 MG capsule  Commonly known as:  PRILOSEC  Take 20 mg by mouth daily.     propranolol 80 MG tablet  Commonly known as:  INDERAL  Take 80 mg by mouth 2 (two) times daily.     risperiDONE 1 MG tablet  Commonly known as:  RISPERDAL  Take 1 mg by mouth at bedtime.        Followup: He will followup in 1 week for catheter removal and to discuss his surgical pathology results.

## 2014-04-20 NOTE — Progress Notes (Signed)
Patient ID: Philip Garza, male   DOB: [birth year], 58 y.o.   MRN: [id number]  1 Day Post-Op Subjective: The patient is doing well.  No nausea or vomiting. Pain is adequately controlled.  Objective: Vital signs in last 24 hours: Temp:  [97.3 F (36.3 C)-98.9 F (37.2 C)] 97.5 F (36.4 C) (05/05 0531) Pulse Rate:  [54-104] 84 (05/05 0221) Resp:  [9-24] 20 (05/05 0531) BP: (103-139)/(59-89) 107/65 mmHg (05/05 0531) SpO2:  [91 %-100 %] 97 % (05/05 0531) Weight:  [97.07 kg (214 lb)] 97.07 kg (214 lb) (05/04 1417)  Intake/Output from previous day: 05/04 0701 - 05/05 0700 In: 9210 [P.O.:600; I.V.:5700; IV Piggyback:2800] Out: 1638 [Urine:4090; Drains:330; Blood:50] Intake/Output this shift:    Physical Exam:  General: Alert and oriented. CV: RRR Lungs: Clear bilaterally. GI: Soft, Nondistended. Incisions: Clean, dry, and intact Urine: Clear Extremities: Nontender, no erythema, no edema.  Lab Results:  Recent Labs  04/19/14 1040 04/20/14 0333  HGB 14.1 12.0*  HCT 40.2 34.1*      Assessment/Plan: POD# 1 s/p robotic prostatectomy.  1) SL IVF 2) Ambulate, Incentive spirometry 3) Transition to oral pain medication 4) Dulcolax suppository 5) D/C pelvic drain 6) Plan for likely discharge later today   [name]. MD   LOS: 1 day   [name] 04/20/2014, 7:10 AM

## 2014-04-20 NOTE — Progress Notes (Signed)
Educated patient and wife about leg bag and foley bag. Both stated that they had attended the urology class recently. Wife and patient were able to verbalize and demonstrate understanding of leg bag and appropriate care. Will discharge patient at this time. Pearline Cables Setzer

## 2014-06-16 ENCOUNTER — Telehealth: Payer: Self-pay | Admitting: Oncology

## 2014-06-16 NOTE — Telephone Encounter (Signed)
S/W PATIENT AND GAVE NP APPT FOR 08/14 @ 10:30 W/DR. SHADAD.  [facility] MA[facility].

## 2014-06-21 ENCOUNTER — Telehealth: Payer: Self-pay | Admitting: Oncology

## 2014-06-21 NOTE — Telephone Encounter (Signed)
C/D 06/21/14 for appt. 07/30/14

## 2014-07-23 ENCOUNTER — Other Ambulatory Visit: Payer: Self-pay | Admitting: Oncology

## 2014-07-23 DIAGNOSIS — C61 Malignant neoplasm of prostate: Secondary | ICD-10-CM

## 2014-07-29 ENCOUNTER — Telehealth: Payer: Self-pay | Admitting: Medical Oncology

## 2014-07-29 NOTE — Telephone Encounter (Signed)
Confirmed tomorrows appt. Patient knows to bring current list of medications. Denies questions.

## 2014-07-30 ENCOUNTER — Encounter (INDEPENDENT_AMBULATORY_CARE_PROVIDER_SITE_OTHER): Payer: Self-pay

## 2014-07-30 ENCOUNTER — Other Ambulatory Visit (HOSPITAL_COMMUNITY): Payer: Self-pay | Admitting: Urology

## 2014-07-30 ENCOUNTER — Other Ambulatory Visit (HOSPITAL_BASED_OUTPATIENT_CLINIC_OR_DEPARTMENT_OTHER): Payer: BC Managed Care – PPO

## 2014-07-30 ENCOUNTER — Ambulatory Visit (HOSPITAL_BASED_OUTPATIENT_CLINIC_OR_DEPARTMENT_OTHER): Payer: BC Managed Care – PPO | Admitting: Oncology

## 2014-07-30 ENCOUNTER — Ambulatory Visit (HOSPITAL_BASED_OUTPATIENT_CLINIC_OR_DEPARTMENT_OTHER): Payer: BC Managed Care – PPO

## 2014-07-30 ENCOUNTER — Encounter: Payer: Self-pay | Admitting: Oncology

## 2014-07-30 VITALS — BP 112/65 | HR 57 | Temp 98.0°F | Resp 18 | Ht 70.0 in | Wt 220.9 lb

## 2014-07-30 DIAGNOSIS — I10 Essential (primary) hypertension: Secondary | ICD-10-CM

## 2014-07-30 DIAGNOSIS — C61 Malignant neoplasm of prostate: Secondary | ICD-10-CM

## 2014-07-30 DIAGNOSIS — M899 Disorder of bone, unspecified: Secondary | ICD-10-CM

## 2014-07-30 DIAGNOSIS — F319 Bipolar disorder, unspecified: Secondary | ICD-10-CM

## 2014-07-30 DIAGNOSIS — M949 Disorder of cartilage, unspecified: Secondary | ICD-10-CM

## 2014-07-30 LAB — CBC WITH DIFFERENTIAL/PLATELET
BASO%: 0.9 % (ref 0.0–2.0)
Basophils Absolute: 0.1 10*3/uL (ref 0.0–0.1)
EOS%: 10 % — ABNORMAL HIGH (ref 0.0–7.0)
Eosinophils Absolute: 0.8 10*3/uL — ABNORMAL HIGH (ref 0.0–0.5)
HCT: 43.2 % (ref 38.4–49.9)
HGB: 14.7 g/dL (ref 13.0–17.1)
LYMPH%: 24.2 % (ref 14.0–49.0)
MCH: 30.5 pg (ref 27.2–33.4)
MCHC: 33.9 g/dL (ref 32.0–36.0)
MCV: 90 fL (ref 79.3–98.0)
MONO#: 0.6 10*3/uL (ref 0.1–0.9)
MONO%: 7.7 % (ref 0.0–14.0)
NEUT#: 4.3 10*3/uL (ref 1.5–6.5)
NEUT%: 57.2 % (ref 39.0–75.0)
Platelets: 168 10*3/uL (ref 140–400)
RBC: 4.8 10*6/uL (ref 4.20–5.82)
RDW: 13.9 % (ref 11.0–14.6)
WBC: 7.5 10*3/uL (ref 4.0–10.3)
lymph#: 1.8 10*3/uL (ref 0.9–3.3)

## 2014-07-30 LAB — COMPREHENSIVE METABOLIC PANEL (CC13)
ALT: 29 U/L (ref 0–55)
ANION GAP: 8 meq/L (ref 3–11)
AST: 24 U/L (ref 5–34)
Albumin: 4 g/dL (ref 3.5–5.0)
Alkaline Phosphatase: 138 U/L (ref 40–150)
BUN: 17.1 mg/dL (ref 7.0–26.0)
CHLORIDE: 109 meq/L (ref 98–109)
CO2: 27 meq/L (ref 22–29)
Calcium: 9.4 mg/dL (ref 8.4–10.4)
Creatinine: 1.4 mg/dL — ABNORMAL HIGH (ref 0.7–1.3)
Glucose: 91 mg/dl (ref 70–140)
POTASSIUM: 4.5 meq/L (ref 3.5–5.1)
Sodium: 144 mEq/L (ref 136–145)
TOTAL PROTEIN: 6.8 g/dL (ref 6.4–8.3)
Total Bilirubin: 0.43 mg/dL (ref 0.20–1.20)

## 2014-07-30 NOTE — Progress Notes (Signed)
Please consult note.

## 2014-07-30 NOTE — Progress Notes (Signed)
Checked in new patient with no financial issues prior to seeing the dr. He has appt card and has not been out of the country.

## 2014-07-30 NOTE — Consult Note (Signed)
Reason for Referral: Prostate cancer.   HPI: Philip Garza is a 58 year old gentleman native of [place] where the majority of his life. He has a past medical history significant for hypertension and bipolar disorder. He was noted to have an increase in his PSA of approximately 4 and subsequently underwent a biopsy 2 years ago. He subsequently had a PSA rise up to 8.1 and underwent a biopsy by Philip Garza which showed prostate cancer Gleason score 4+3 equals 7 with 10/28 lymph nodes were positive for malignancy. He subsequently was evaluated by Philip Garza in his staging workup did not show clear-cut malignancy but did show a focal uptake in the posterior aspect of the L3 vertebral body on a bone scan. CT scan failed to replicate any bony metastasis. He subsequently underwent radical prostatectomy and bilateral lymph node dissection on 04/19/2014. The pathology showed pathological stage T2c N0 prostate cancer with a Gleason score 4+3 equals 7. He had negative surgical margins with 17 lymph nodes sampled none were involved with malignancy. His postoperative PSA continue to be persistently high at 5.89 on 06/11/2014 and most recently in August of 2015 was 7.92. He was evaluated by Philip Garza and was recommended to start hormonal therapy and he wanted in medical oncology opinion.  Clinically, he is asymptomatic from his prostate cancer. He does not report any headaches or blurry vision or double vision. Does report any syncope or seizures. He does not report any fevers or chills or sweats. At that point chest pain or shortness of breath. Does not report any cough or hemoptysis. Present report any nausea or vomiting or abdominal pain. Present for any constipation or diarrhea. He is reporting increased frequency of urination but no dysuria or hematuria. He still has some occasional incontinence. He does not report any skeletal complaints of arthralgias or myalgias. He is not report any back pain but does report shoulder  pain and planning to have surgery in the near future. It is not appropriate lymphadenopathy or petechiae. Rest of his review of systems unremarkable   Past Medical History  Diagnosis Date  . Hypertension   . Sleep apnea   . Anxiety   . Bipolar disorder 2008    paranoia/ hospitalized,  now sees psychiatrist  . GERD (gastroesophageal reflux disease)   . Cancer     prostate  . Anemia   :  Past Surgical History  Procedure Laterality Date  . Tympanoplasty Right 1976  . Vasectomy    . Robot assisted laparoscopic radical prostatectomy N/A 04/19/2014    Procedure: ROBOTIC ASSISTED LAPAROSCOPIC RADICAL PROSTATECTOMY LEVEL 2;  Surgeon: Philip Gray, MD;  Location: [facility];  Service: Urology;  Laterality: N/A;  . Lymphadenectomy Bilateral 04/19/2014    Procedure: LYMPHADENECTOMY;  Surgeon: Philip Gray, MD;  Location: [facility];  Service: Urology;  Laterality: Bilateral;  :  Current outpatient prescriptions:buPROPion (WELLBUTRIN XL) 300 MG 24 hr tablet, Take 300 mg by mouth every morning., Disp: , Rfl: ;  clonazePAM (KLONOPIN) 0.5 MG tablet, Take 0.5 mg by mouth every morning., Disp: , Rfl: ;  ferrous sulfate 325 (65 FE) MG tablet, Take 325 mg by mouth daily with breakfast., Disp: , Rfl: ;  lisinopril (PRINIVIL,ZESTRIL) 20 MG tablet, Take 20 mg by mouth every morning., Disp: , Rfl:  omeprazole (PRILOSEC) 20 MG capsule, Take 20 mg by mouth daily., Disp: , Rfl: ;  propranolol (INDERAL) 80 MG tablet, Take 80 mg by mouth 2 (two) times daily., Disp: , Rfl: ;  risperiDONE (RISPERDAL) 1 MG  tablet, Take 1 mg by mouth at bedtime., Disp: , Rfl: :  No Known Allergies:  No family history on file.:  History   Social History  . Marital Status: Married    Spouse Name: N/A    Number of Children: N/A  . Years of Education: N/A   Occupational History  . Not on file.   Social History Main Topics  . Smoking status: Never Smoker   . Smokeless tobacco: Never Used  . Alcohol Use: No  . Drug Use: No  . Sexual  Activity: Not on file   Other Topics Concern  . Not on file   Social History Narrative  . No narrative on file  :  Pertinent items are noted in HPI.  Exam: ECOG 0 Blood pressure 112/65, pulse 57, temperature 98 F (36.7 C), temperature source Oral, resp. rate 18, height 5\' 10"  (1.778 m), weight 220 lb 14.4 oz (100.2 kg). General appearance: alert and cooperative Head: Normocephalic, without obvious abnormality Throat: lips, mucosa, and tongue normal; teeth and gums normal Neck: no adenopathy Back: symmetric, no curvature. ROM normal. No CVA tenderness. Resp: clear to auscultation bilaterally Cardio: regular rate and rhythm, S1, S2 normal, no murmur, click, rub or gallop GI: soft, non-tender; bowel sounds normal; no masses,  no organomegaly Extremities: extremities normal, atraumatic, no cyanosis or edema Pulses: 2+ and symmetric Skin: Skin color, texture, turgor normal. No rashes or lesions Lymph nodes: Cervical, supraclavicular, and axillary nodes normal.   Recent Labs  07/30/14 1027  WBC 7.5  HGB 14.7  HCT 43.2  PLT 168    Recent Labs  07/30/14 1027  NA 144  K 4.5  CO2 27  GLUCOSE 91  BUN 17.1  CREATININE 1.4*  CALCIUM 9.4     Assessment and Plan:   58 year old gentleman with the following issues:  1. Prostate cancer diagnosed  2015 when he presented with a PSA of 8.1 and a Gleason score 4+3 equals 7. He underwent radical prostatectomy in 04/19/2014 and the pathology revealed a Gleason score 4+3 equals 7 with a pathological staging of T2cN0. His lymph node sampling did not show any evidence of malignancy. His postoperative PSA continue to be persistently high and most recently it was elevated to 7.92. His preoperative staging workup did not reveal any clear-cut malignancy but did have a lesion on the L3 vertebral body that is undetermined. The natural course of this disease was discussed today excessively with the patient and his wife. I explained to him the  persistent rise in his PSA is worrisome for microscopic metastatic disease. I doubt that he has local disease that would benefit from radiation therapy.  Treatment options were discussed today. I agree with Philip Garza the first of this to be station with a CT scan and a bone scan or possible PET scan. If she has measurable disease that is low-volume, then I would recommend androgen depravation. If he has measurable disease that is high-volume multiple bony metastasis or visceral metastasis then I would consider combination of hormonal therapy and chemotherapy.  If his imaging studies did not show any measurable disease and he has biochemical relapse in the options maybe a little bit more complex. I still would suggest that androgen deprivation is necessary at some point. One could argue a period of observation or intermittent androgen deprivation could be a possibility as well.  Risks and benefits of all these approaches were discussed including complications related to androgen deprivation as well as systemic chemotherapy. All the  questions were answered to their satisfaction.  2. Followup: Will be as needed depending on the results of the imaging studies and whether he wants to have any further discussion regarding his overall prostate cancer care.  50 minutes spent with the patient today discussing the diagnosis, prognosis and treatment options for his cancer more than 50% of that was face-to-face.

## 2014-07-31 LAB — PSA: PSA: 15.63 ng/mL — ABNORMAL HIGH (ref <=4.00)

## 2014-07-31 LAB — TESTOSTERONE: Testosterone: 262 ng/dL — ABNORMAL LOW (ref 300–890)

## 2014-08-04 ENCOUNTER — Other Ambulatory Visit (HOSPITAL_COMMUNITY): Payer: Self-pay | Admitting: Urology

## 2014-08-04 DIAGNOSIS — C61 Malignant neoplasm of prostate: Secondary | ICD-10-CM

## 2014-08-10 ENCOUNTER — Other Ambulatory Visit (HOSPITAL_COMMUNITY): Payer: Self-pay | Admitting: Urology

## 2014-08-10 DIAGNOSIS — C61 Malignant neoplasm of prostate: Secondary | ICD-10-CM

## 2014-08-19 ENCOUNTER — Encounter (HOSPITAL_COMMUNITY): Payer: BC Managed Care – PPO

## 2014-08-19 ENCOUNTER — Encounter (HOSPITAL_COMMUNITY): Payer: Self-pay

## 2014-08-19 ENCOUNTER — Ambulatory Visit (HOSPITAL_COMMUNITY): Payer: BC Managed Care – PPO

## 2014-08-19 ENCOUNTER — Ambulatory Visit (HOSPITAL_COMMUNITY)
Admission: RE | Admit: 2014-08-19 | Discharge: 2014-08-19 | Disposition: A | Discharge status: Home / Self Care | Payer: BC Managed Care – PPO | Source: Ambulatory Visit | Attending: Urology | Admitting: Urology

## 2014-08-19 DIAGNOSIS — C7951 Secondary malignant neoplasm of bone: Secondary | ICD-10-CM | POA: Insufficient documentation

## 2014-08-19 DIAGNOSIS — C61 Malignant neoplasm of prostate: Secondary | ICD-10-CM | POA: Diagnosis present

## 2014-08-19 DIAGNOSIS — C7952 Secondary malignant neoplasm of bone marrow: Secondary | ICD-10-CM

## 2014-08-19 MED ORDER — FLUDEOXYGLUCOSE F - 18 (FDG) INJECTION: 10.5000 | Freq: Once | INTRAVENOUS | Status: AC | PRN

## 2014-08-28 ENCOUNTER — Other Ambulatory Visit: Payer: Self-pay | Admitting: Oncology

## 2014-08-30 ENCOUNTER — Telehealth: Payer: Self-pay | Admitting: Oncology

## 2014-08-30 NOTE — Telephone Encounter (Signed)
s.w. pt and and advised on OCT appt....pt was already aware per [facility]

## 2014-09-21 ENCOUNTER — Ambulatory Visit (HOSPITAL_BASED_OUTPATIENT_CLINIC_OR_DEPARTMENT_OTHER): Payer: BC Managed Care – PPO | Admitting: Oncology

## 2014-09-21 VITALS — BP 124/83 | HR 73 | Temp 98.0°F | Resp 18 | Ht 70.0 in | Wt 227.6 lb

## 2014-09-21 DIAGNOSIS — C7951 Secondary malignant neoplasm of bone: Secondary | ICD-10-CM

## 2014-09-21 DIAGNOSIS — C61 Malignant neoplasm of prostate: Secondary | ICD-10-CM

## 2014-09-21 NOTE — Progress Notes (Signed)
Hematology and Oncology Follow Up Visit  Philip Garza [id number] 26-Apr-1956 58 y.o. 09/21/2014 4:48 PM Philip Garza, MDIsernia, Philip Rinks, MD   Principle Diagnosis: 58 year old gentleman with prostate cancer diagnosed in May of 2014. He had a Gleason score of 4+3 equals 7 PSA of 8.1 pathological staging of T2 C. N0. 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   Prior Therapy: Status post radical prostatectomy done on 04/19/2014.  Current therapy: Androgen deprivation under the care of Dr. Alinda Money. He is under evaluation for possible chemotherapy.  Interim History:  [name] presents today for a followup visit. Since his last visit, he's been feeling relatively well. He does report shoulder pain related to recent shoulder operation for the most part asymptomatic. He does not report any headaches or blurry vision or double vision. Does report any syncope or seizures. He does not report any fevers or chills or sweats. At that point chest pain or shortness of breath. Does not report any cough or hemoptysis. Present report any nausea or vomiting or abdominal pain. Present for any constipation or diarrhea. He is reporting increased frequency of urination but no dysuria or hematuria. He still has some occasional incontinence. He does not report any skeletal complaints of arthralgias or myalgias. He is not report any back pain but does report shoulder pain and planning to have surgery in the near future. It is not appropriate lymphadenopathy or petechiae. Rest of his review of systems unremarkable     Medications: I have reviewed the patient's current medications.  Current Outpatient Prescriptions  Medication Sig Dispense Refill  . buPROPion (WELLBUTRIN XL) 300 MG 24 hr tablet Take 300 mg by mouth every morning.      . calcium-vitamin D (OSCAL WITH D) 500-200 MG-UNIT per tablet Take 1 tablet by mouth 2 (two) times daily.      . clonazePAM (KLONOPIN) 0.5 MG tablet Take 0.5 mg by mouth every morning.       . ferrous sulfate 325 (65 FE) MG tablet Take 325 mg by mouth daily with breakfast.      . lisinopril (PRINIVIL,ZESTRIL) 20 MG tablet Take 20 mg by mouth every morning.      Marland Kitchen omeprazole (PRILOSEC) 20 MG capsule Take 20 mg by mouth daily.      . propranolol (INDERAL) 80 MG tablet Take 80 mg by mouth 2 (two) times daily.      . risperiDONE (RISPERDAL) 1 MG tablet Take 1 mg by mouth at bedtime.       No current facility-administered medications for this visit.     Allergies: No Known Allergies  Past Medical History, Surgical history, Social history, and Family History were reviewed and updated.   Physical Exam: Blood pressure 124/83, pulse 73, temperature 98 F (36.7 C), temperature source Oral, resp. rate 18, height 5\' 10"  (1.778 m), weight 227 lb 9.6 oz (103.239 kg). ECOG: 0 General appearance: alert and cooperative Head: Normocephalic, without obvious abnormality Neck: no adenopathy Lymph nodes: Cervical, supraclavicular, and axillary nodes normal. Heart:regular rate and rhythm, S1, S2 normal, no murmur, click, rub or gallop Lung:chest clear, no wheezing, rales, normal symmetric air entry Abdomin: soft, non-tender, without masses or organomegaly EXT:no erythema, induration, or nodules   Lab Results: Lab Results  Component Value Date   WBC 7.5 07/30/2014   HGB 14.7 07/30/2014   HCT 43.2 07/30/2014   MCV 90.0 07/30/2014   PLT 168 07/30/2014     Chemistry      Component Value Date/Time   NA 144  07/30/2014 1027   NA 140 04/20/2014 0333   K 4.5 07/30/2014 1027   K 4.7 04/20/2014 0333   CL 107 04/20/2014 0333   CO2 27 07/30/2014 1027   CO2 22 04/20/2014 0333   BUN 17.1 07/30/2014 1027   BUN 15 04/20/2014 0333   CREATININE 1.4* 07/30/2014 1027   CREATININE 1.45* 04/20/2014 0333      Component Value Date/Time   CALCIUM 9.4 07/30/2014 1027   CALCIUM 7.9* 04/20/2014 0333   ALKPHOS 138 07/30/2014 1027   AST 24 07/30/2014 1027   ALT 29 07/30/2014 1027   BILITOT 0.43 07/30/2014 1027        Radiological Studies:  EXAM:  NUCLEAR MEDICINE NaF PET WHOLE BODY  TECHNIQUE:  10.5 mCi F-18 NaF was injected intravenously. Full-ring PET imaging  was performed from the vertex to the feet after the radiotracer. CT  data was obtained and used for attenuation correction and anatomic  localization.  FASTING BLOOD GLUCOSE: Not applicable  COMPARISON: None.  FINDINGS:  Skeleton: Multifocal areas of increased radiotracer uptake are  identified compatible with bone metastasis. The largest focus  involves the L3 vertebral body. There is a small focus of increased  uptake localizing to the posterior aspect of the L4 vertebra. Also  worrisome for metastatic disease. Metastasis is noted at the C5  vertebra as well as the right iliac wing, posterior column of left  acetabulum and superior right acetabulum. There is also increased  uptake involving the lateral aspect of the right ninth rib.  Increased radiotracer uptake involving the proximal right humerus  and right glenoid is nonspecific and may reflect arthropathic  change.  IMPRESSION:  1. Findings compatible with multifocal areas of increased uptake  compatible with osseous metastatic disease.    Impression and Plan:  58 year old gentleman with the following issues:  1. Prostate cancer diagnosed in May of 2014 and status post prostatectomy with PSA most recently rising up to 15.6. His nuclear study findings done on 08/19/2014 was discussed today and showed findings compatible with multiple area of metastatic disease. He already started androgen deprivation and the role of systemic chemotherapy was discussed today excessively. Risks and benefits of adding 6 cycles of Taxotere chemotherapy and androgen depravation was discussed today. Complications include nausea, vomiting, myelosuppression, neutropenia, neutropenic sepsis, infusion related complications, hair loss as well as hair and nail changes. The benefit there is depending on  the bulk of disease. Based on at least 2 studies we've had survival advantage approaching 13 months. At this time, he is not quite sure it is worth it for him to add chemotherapy up front and he is still undecided. I feel that chemotherapy would benefit him and he would be a reasonable candidate for. He would like to have more time to think about it and certainly will support his decision regardless.  2. Neutropenia prophylaxis: He will require Neulasta after each infusion for neutropenia prophylaxis.  3. IV access: He will require a Port-A-Cath insertion and he elected to proceed with systemic chemotherapy. Complications that includes infection, bleeding and thrombosis are to be considered.  He will let me know in the near future and anticipate starting chemotherapy anorexia weeks if he decided to proceed.   Zola Button, MD 10/6/20154:48 PM

## 2014-09-30 ENCOUNTER — Encounter: Payer: Self-pay | Admitting: Oncology

## 2015-03-27 IMAGING — CR DG CHEST 2V
2 series · 2 of 2 positions shown · non-contrast
Comparison: 04/08/2014

CLINICAL DATA: Preop for prostatectomy.  Hypertension.

EXAM:
CHEST  2 VIEW

[w chest pa]
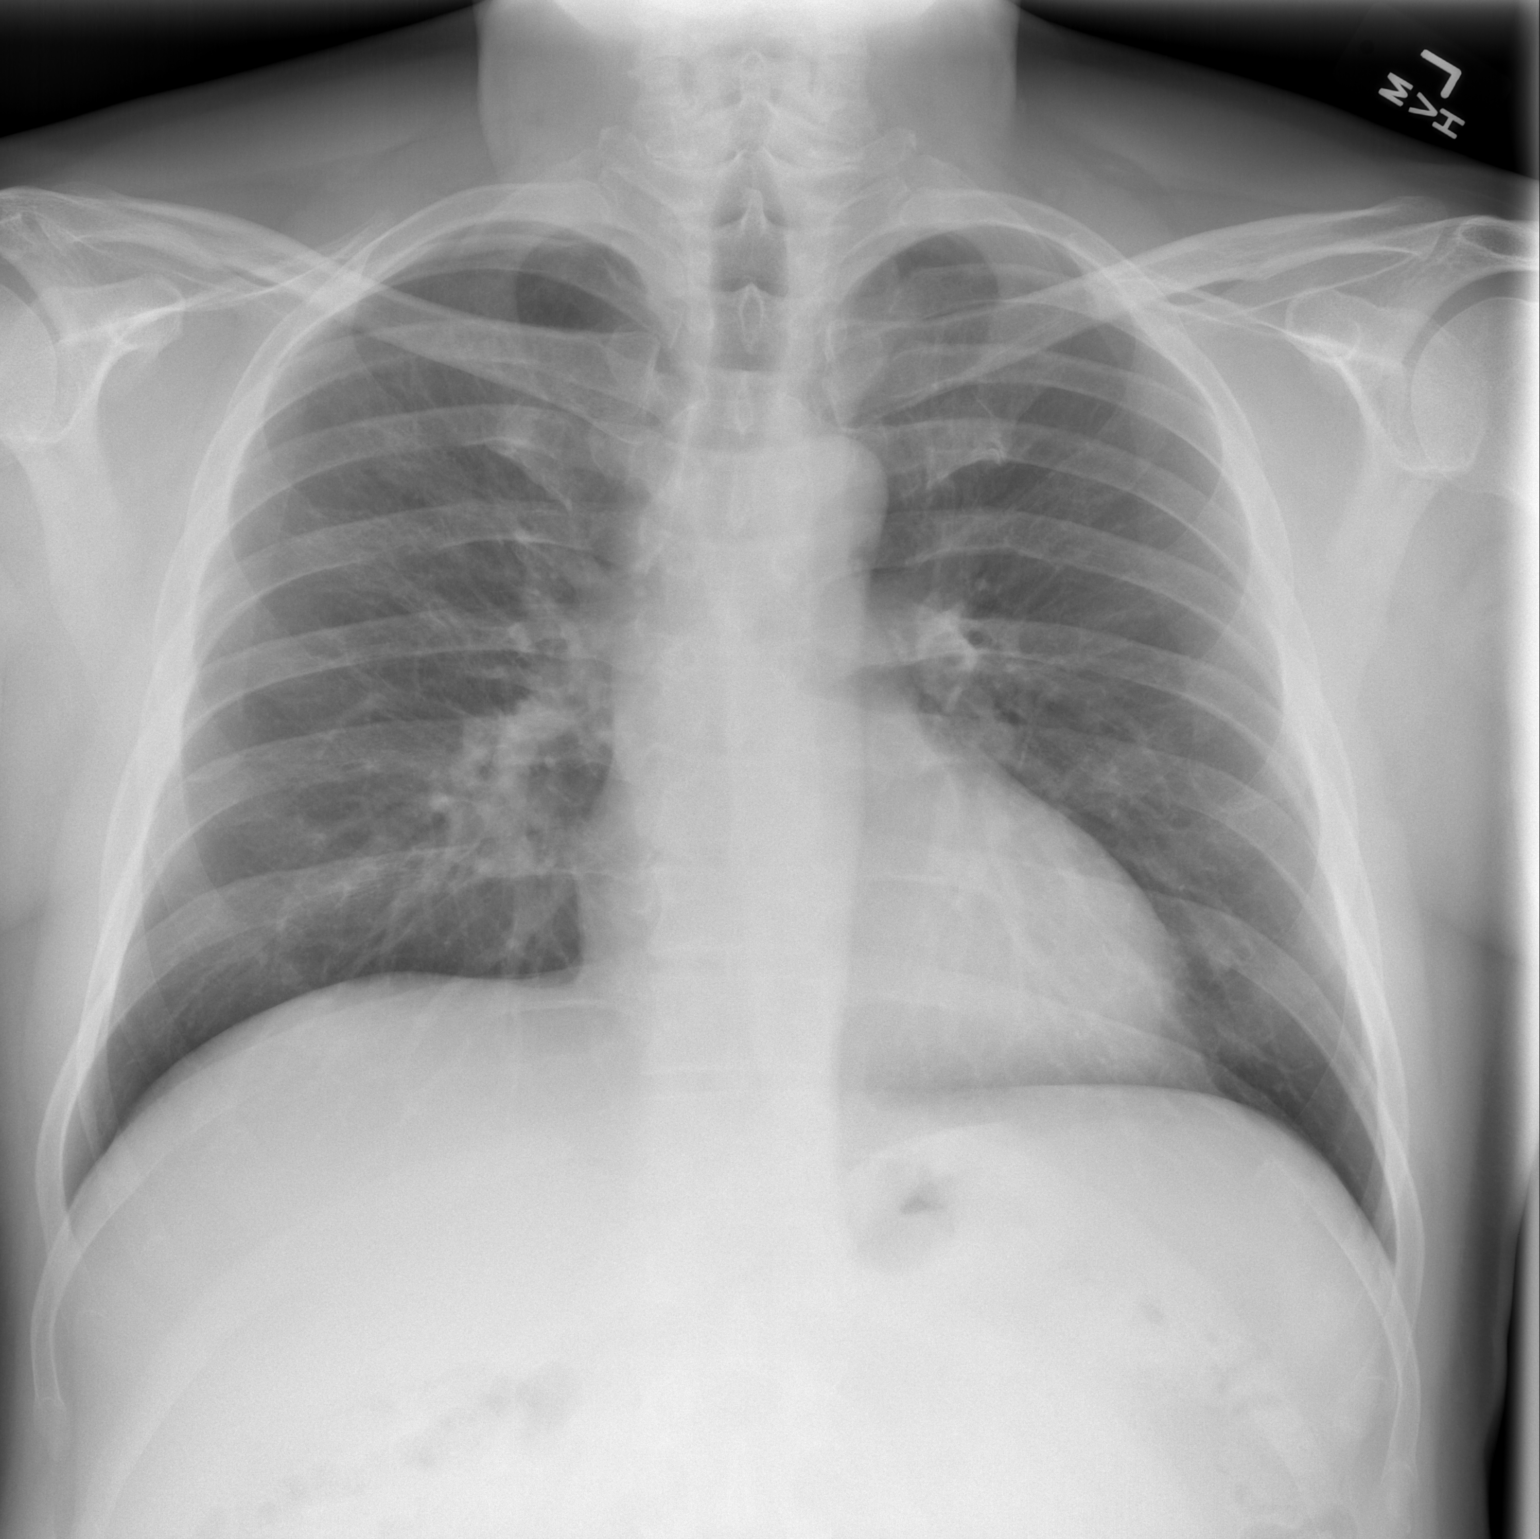

[w chest lat]
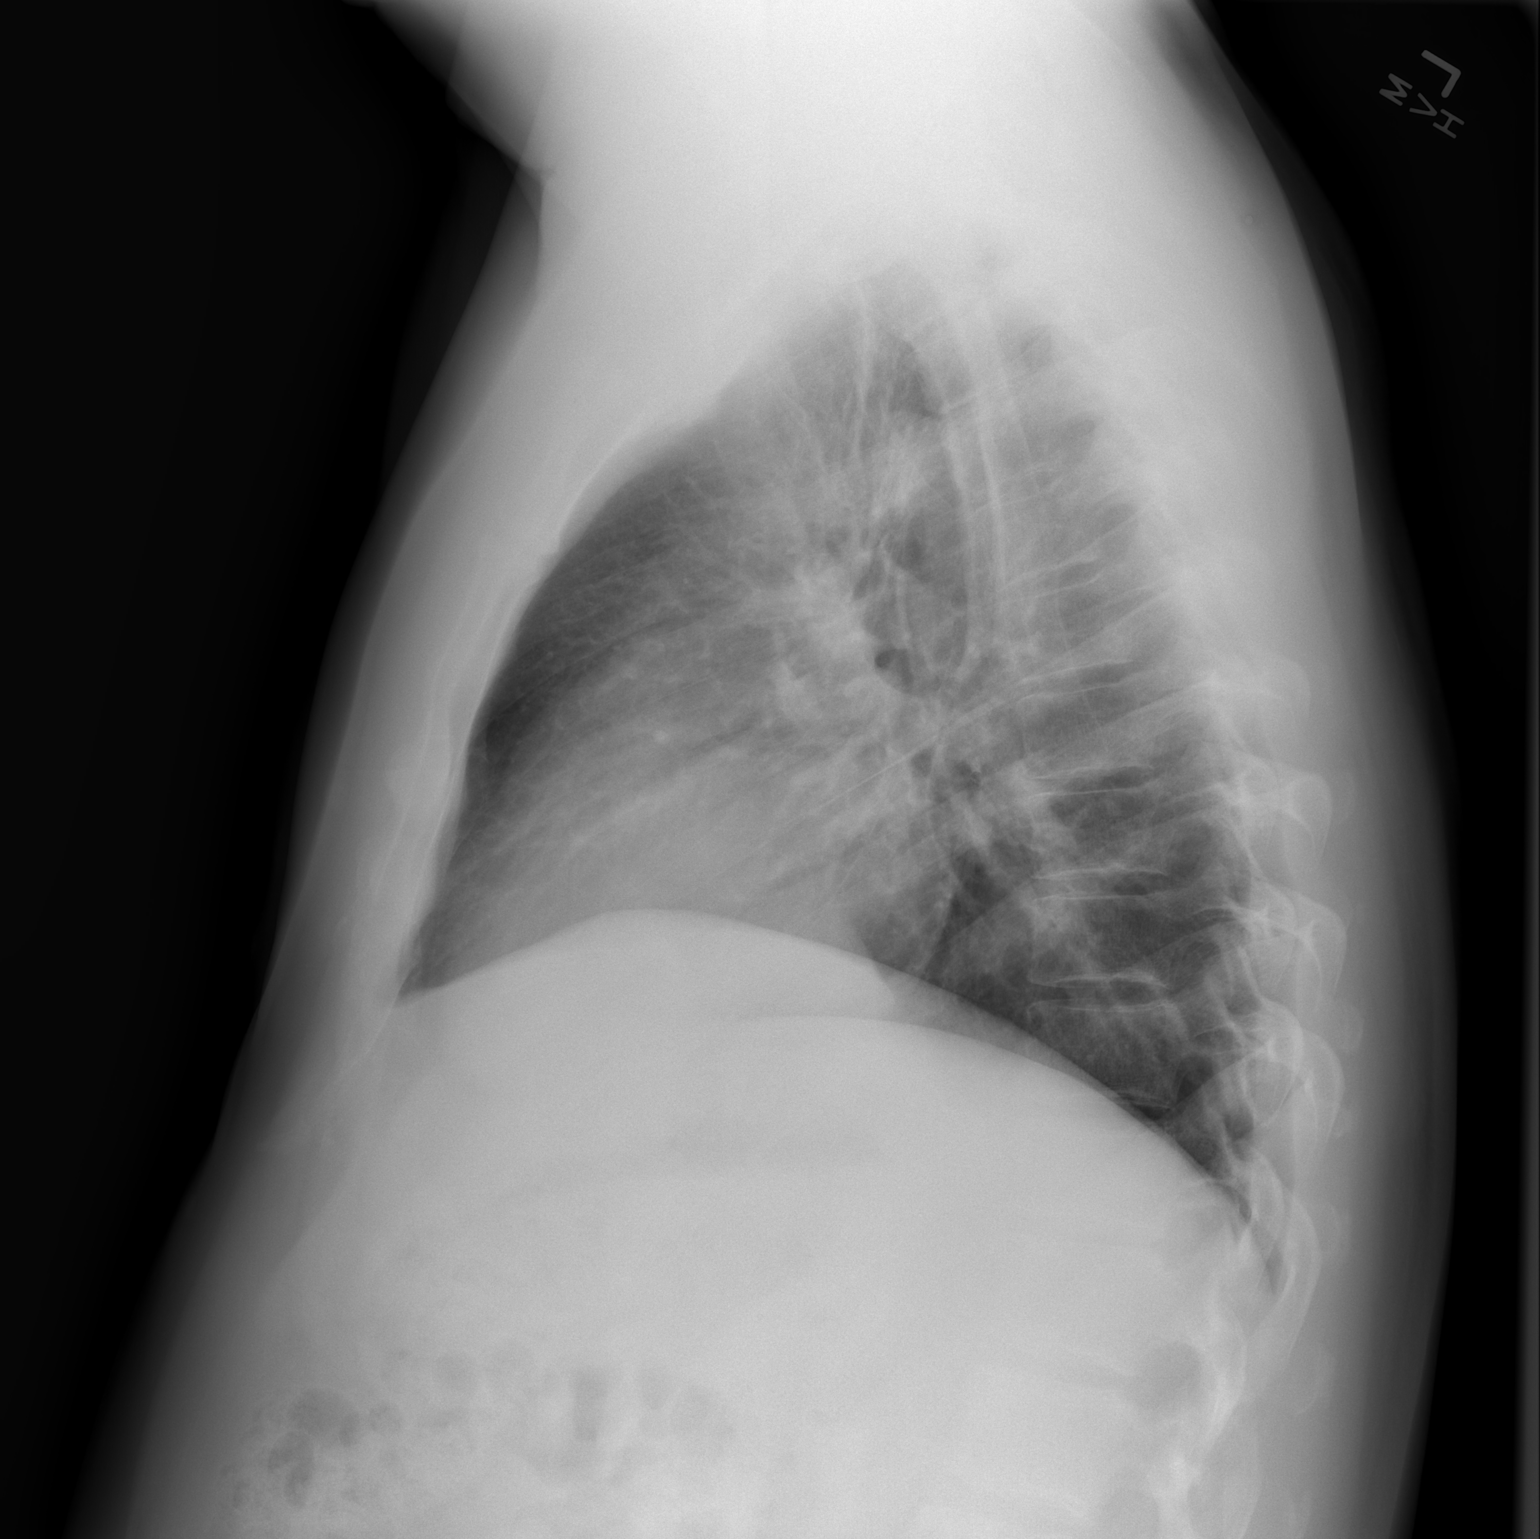

[2 of 2 positions shown; findings below may reference images not displayed]

FINDINGS: The heart size and mediastinal contours are within normal limits.
Both lungs are clear. The visualized skeletal structures are
unremarkable.
IMPRESSION: No active cardiopulmonary disease.

## 2015-08-07 IMAGING — PT NM PET BONE METS(NOPR) WHOLE BODY (NAF)
7 of 8 series · 20 of 25 positions shown · non-contrast
Comparison: None.

CLINICAL DATA: Initial treatment strategy for prostate cancer

EXAM:
NUCLEAR MEDICINE NaF PET WHOLE BODY
TECHNIQUE: 10.5 mCi F-18 NaF was injected intravenously. Full-ring PET imaging
was performed from the vertex to the feet after the radiotracer. CT
data was obtained and used for attenuation correction and anatomic
localization.
FASTING BLOOD GLUCOSE:  Not applicable

[Series 3: ct wb 5.0 b31f · axial · 5.0mm · 0.98mm/px · z∈[-860,+1004]mm · 4 of 467 slices shown]
[im 1/467  full-range]
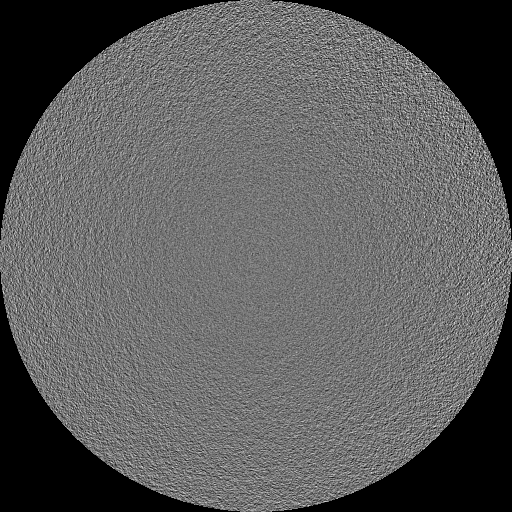
[im 117/467  soft-tissue]
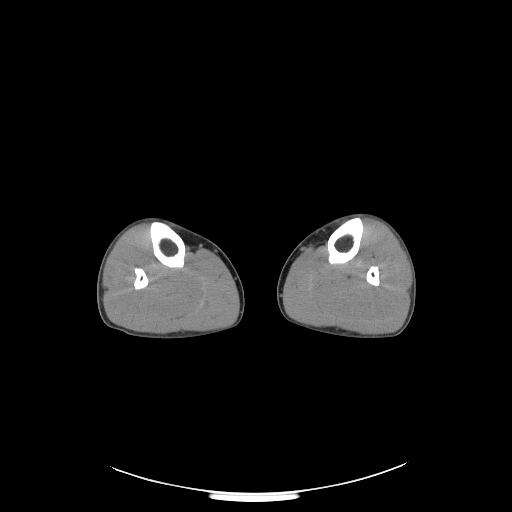
[im 350/467  bone]
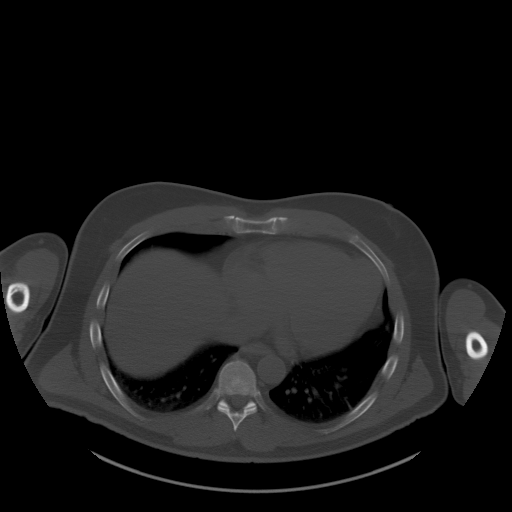
[im 467/467]
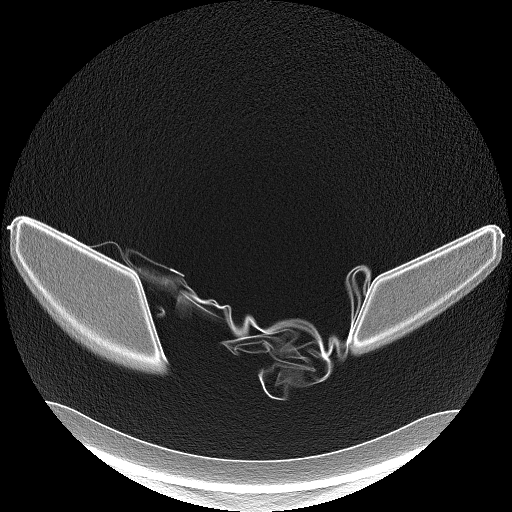

[Series 4: pet wb ac · axial · 5.0mm · 4.07mm/px · z∈[-860,+1004]mm · 4 of 467 slices shown]
[im 1/467]
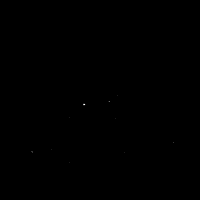
[im 117/467]
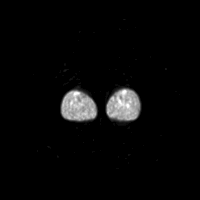
[im 350/467]
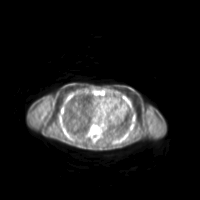
[im 467/467]
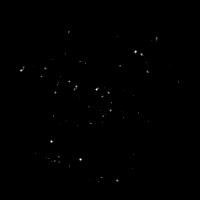

[Series 6: ct wb 5.0 q b70f lung_bone · axial · 5.0mm · 0.78mm/px · 1 of 69 slices shown]
[im 1/69  bone]
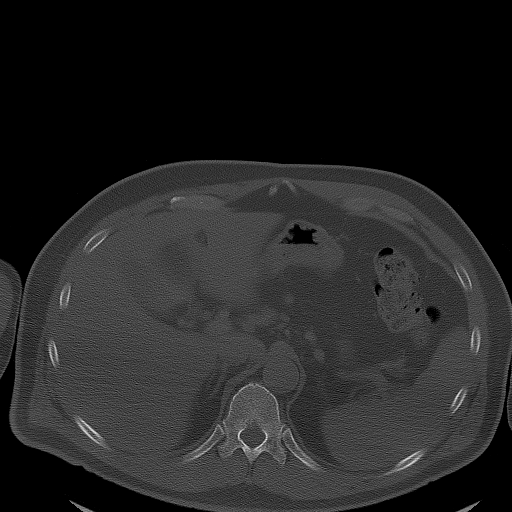

[Series 8: pet wb nac · axial · 5.0mm · 4.07mm/px · z∈[-860,+1004]mm · 4 of 467 slices shown]
[im 1/467  full-range]
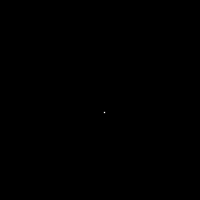
[im 234/467]
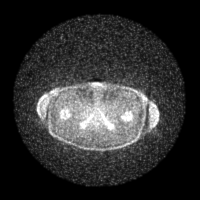
[im 350/467]
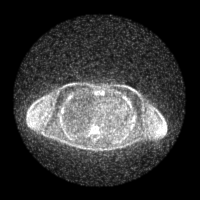
[im 467/467]
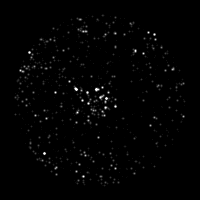

[Series 604: mip collection<mip range> · coronal · 3.86mm/px · 1 of 32 slices shown]
[im 1/32]
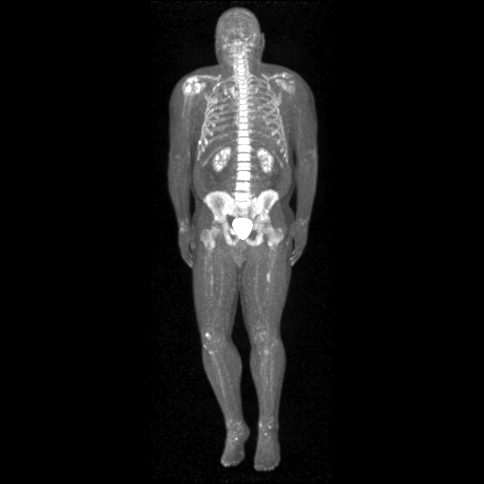

[Series 606: range-ct wb 5.0 (id)<alpha range> · 5 of 448 slices shown]
[im 1/448]
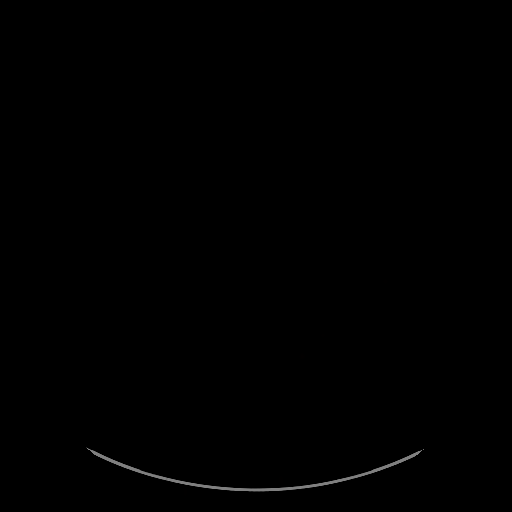
[im 90/448]
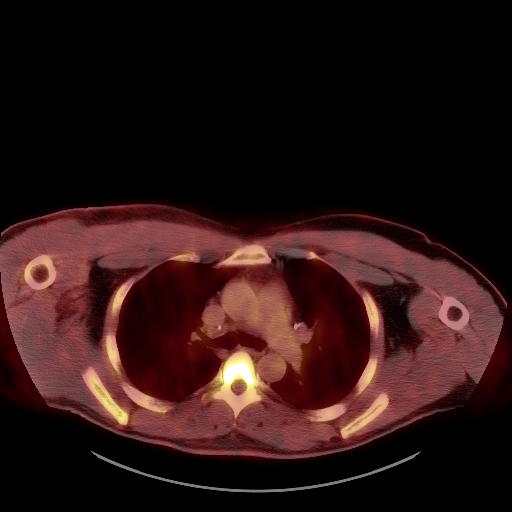
[im 179/448]
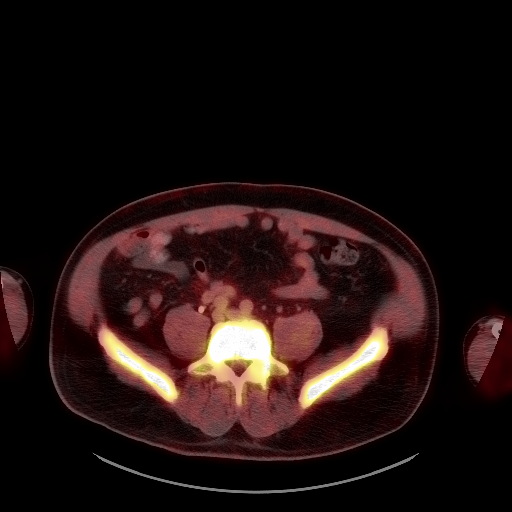
[im 269/448]
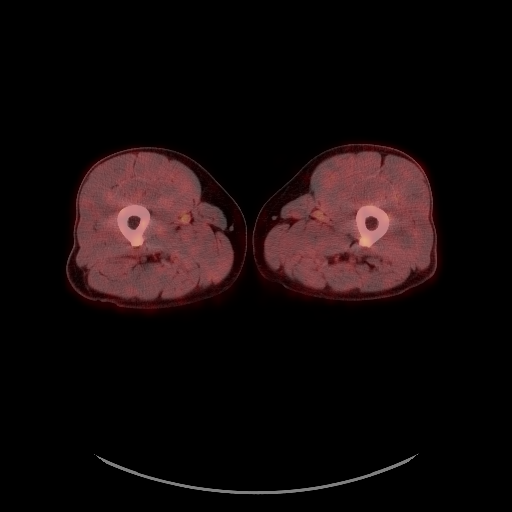
[im 448/448]
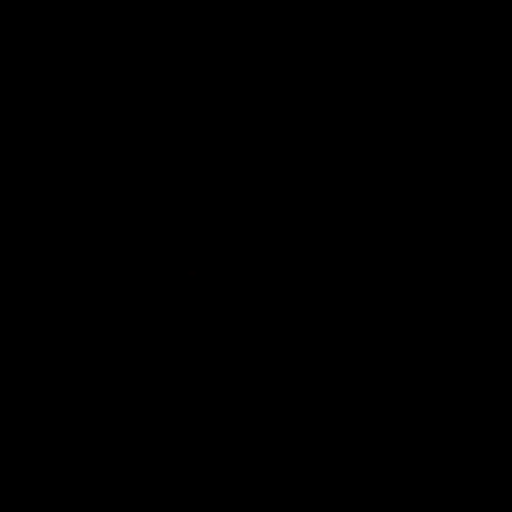

[Series 1032: results mm oncology reading · 0.71mm/px · 1 of 6 slices shown]
[im 1/6]
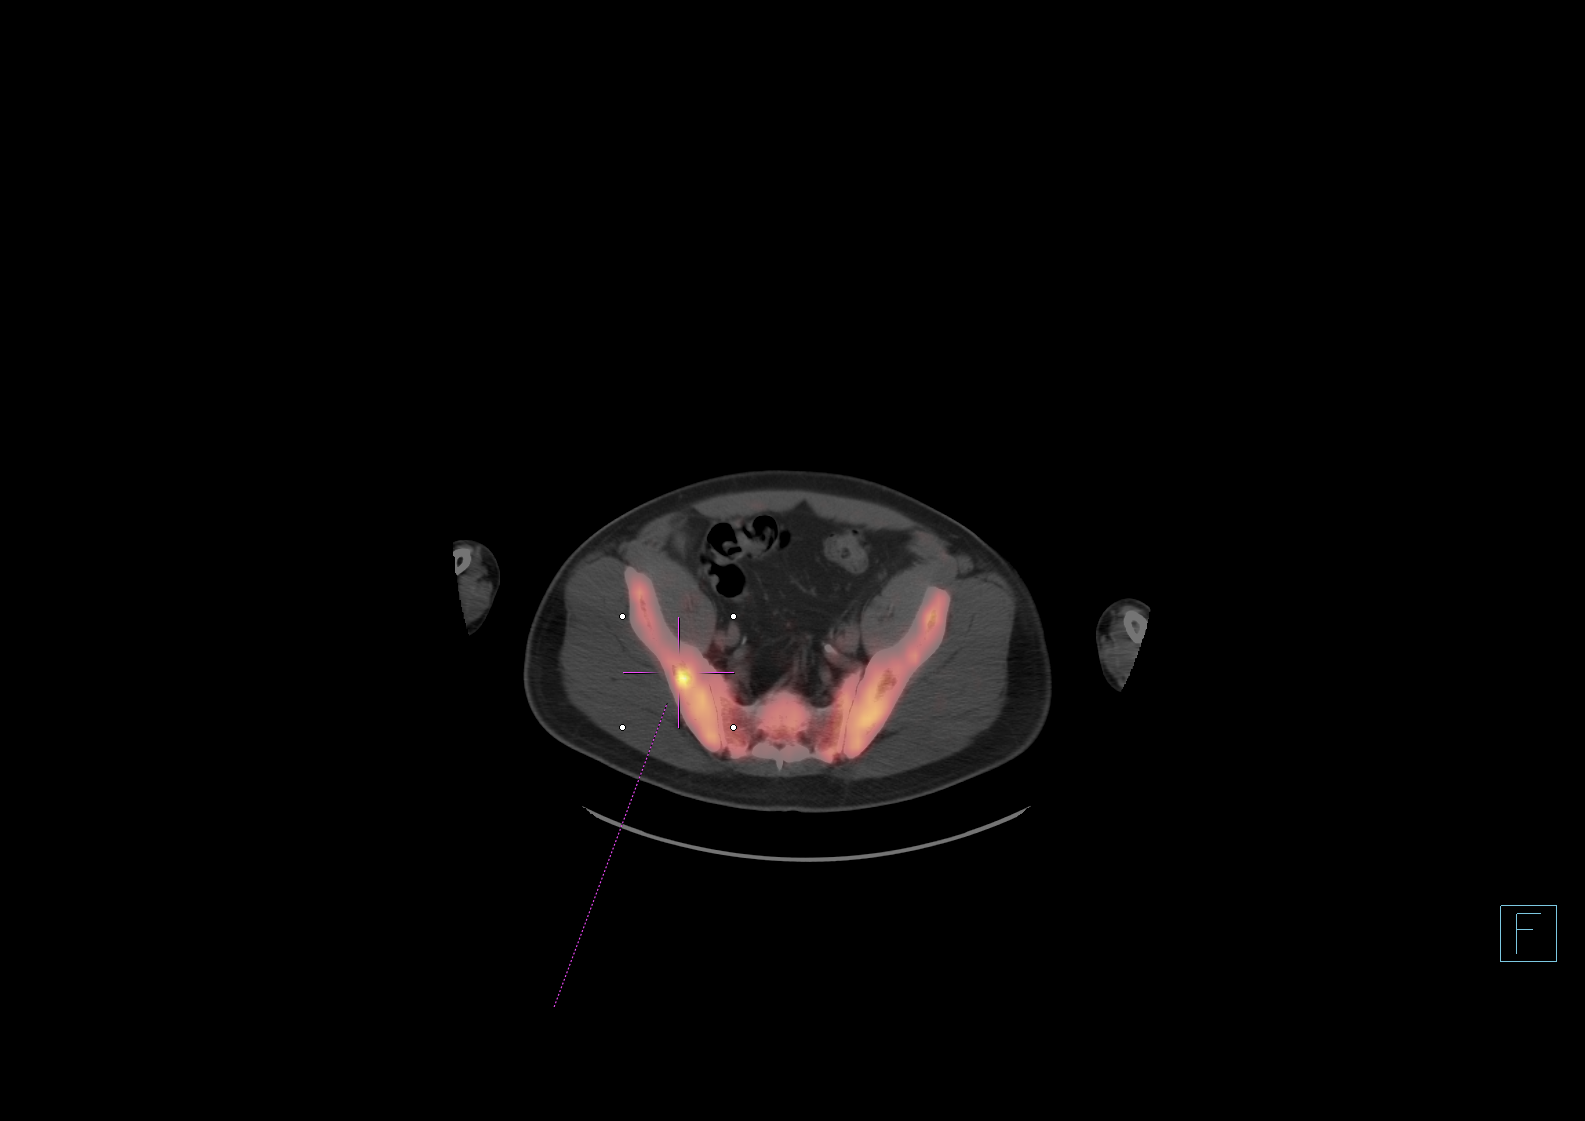

[20 of 25 positions shown; findings below may reference images not displayed]

FINDINGS: Skeleton: Multifocal areas of increased radiotracer uptake are
identified compatible with bone metastasis. The largest focus
involves the L3 vertebral body. There is a small focus of increased
uptake localizing to the posterior aspect of the L4 vertebra. Also
worrisome for metastatic disease. Metastasis is noted at the C5
vertebra as well as the right iliac wing, posterior column of left
acetabulum and superior right acetabulum. There is also increased
uptake involving the lateral aspect of the right ninth rib.
Increased radiotracer uptake involving the proximal right humerus
and right glenoid is nonspecific and may reflect arthropathic
change.
IMPRESSION: 1. Findings compatible with multifocal areas of increased uptake
compatible with osseous metastatic disease.

## 2018-12-29 ENCOUNTER — Encounter: Payer: Self-pay | Admitting: Emergency Medicine

## 2018-12-29 DIAGNOSIS — F411 Generalized anxiety disorder: Secondary | ICD-10-CM

## 2018-12-29 DIAGNOSIS — F319 Bipolar disorder, unspecified: Secondary | ICD-10-CM | POA: Insufficient documentation

## 2019-01-25 ENCOUNTER — Other Ambulatory Visit: Payer: Self-pay | Admitting: Physician Assistant

## 2019-01-26 NOTE — Telephone Encounter (Signed)
Last fill 07/31/2018 #180

## 2019-01-26 NOTE — Telephone Encounter (Signed)
Need to review paper chart not seen in epic  

## 2019-02-04 ENCOUNTER — Encounter: Payer: Self-pay | Admitting: Physician Assistant

## 2019-02-04 ENCOUNTER — Ambulatory Visit: Payer: BLUE CROSS/BLUE SHIELD | Admitting: Physician Assistant

## 2019-02-04 DIAGNOSIS — F319 Bipolar disorder, unspecified: Secondary | ICD-10-CM

## 2019-02-04 DIAGNOSIS — F411 Generalized anxiety disorder: Secondary | ICD-10-CM | POA: Diagnosis not present

## 2019-02-04 MED ORDER — RISPERIDONE 1 MG PO TABS: 1.0000 mg | ORAL_TABLET | Freq: Every day | ORAL | 2 refills | Status: DC

## 2019-02-04 MED ORDER — BUPROPION HCL ER (XL) 150 MG PO TB24: 150.0000 mg | ORAL_TABLET | Freq: Every day | ORAL | 2 refills | Status: AC

## 2019-02-04 MED ORDER — CLONAZEPAM 0.5 MG PO TABS: ORAL_TABLET | ORAL | 1 refills | Status: AC

## 2019-02-04 NOTE — Progress Notes (Signed)
[facility] Med Check  Patient ID: Philip Garza,  MRN: [id number]  PCP: Lonia Mad, MD  Date of Evaluation: 02/04/2019 Time spent:15 minutes  Chief Complaint:  Chief Complaint    Follow-up      HISTORY/CURRENT STATUS: HPI Here for 9 month med check.  Since LOV, the metastatic prostate cancer has worsened.  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  He is not having any bowel or bladder incontinence or numbness, tingling or weakness in his lower extremities.  He does have some radiating pain in the left lower back. [facility] records show cord compression in the problem list.  Has had Provenge, 3 treatments. States Dr. Tasia Catchings wanted him to ask me if we could decrease the Wellbutrin to 150 mg.  He started a new treatment called Zytiga a few weeks ago.  He still takes the Lupron every 6 months.  Patient denies loss of interest in usual activities and is able to enjoy things.  Denies decreased energy or motivation.  Appetite has not changed.  No extreme sadness, tearfulness, or feelings of hopelessness.  Denies any changes in concentration, making decisions or remembering things.  Denies suicidal or homicidal thoughts.  Patient denies increased energy with decreased need for sleep, no increased talkativeness, no racing thoughts, no impulsivity or risky behaviors, no increased spending, no increased libido, no grandiosity.  Denies muscle or joint pain, stiffness, or dystonia.  Denies dizziness, syncope, seizures, numbness, tingling, tremor, tics, unsteady gait, slurred speech, confusion.   Individual Medical History/ Review of Systems: Changes? :Yes  See above  Past medications for mental health diagnoses include: Lexapro, Geodon, Klonopin, Wellbutrin, Prozac, Risperdal, Intuniv  Allergies: Patient has no known allergies.  Current Medications:  Current  Outpatient Medications:  .  abiraterone acetate (ZYTIGA) 250 MG tablet, Take by mouth., Disp: , Rfl:  .  allopurinol (ZYLOPRIM) 300 MG tablet, Take by mouth., Disp: , Rfl:  .  b complex vitamins tablet, Take 1 tablet by mouth daily., Disp: , Rfl:  .  calcium-vitamin D (OSCAL WITH D) 500-200 MG-UNIT per tablet, Take 1 tablet by mouth 2 (two) times daily., Disp: , Rfl:  .  clonazePAM (KLONOPIN) 0.5 MG tablet, TAKE 1/2 - 1 TABLET BY MOUTH TWICE A DAY AS NEEDED FOR ANXIETY, Disp: 180 tablet, Rfl: 1 .  ferrous sulfate 325 (65 FE) MG tablet, Take 325 mg by mouth daily with breakfast., Disp: , Rfl:  .  Leuprolide Acetate, 6 Month, (LUPRON DEPOT, 83-MONTH, IM), Inject into the muscle., Disp: , Rfl:  .  lisinopril (PRINIVIL,ZESTRIL) 20 MG tablet, Take 20 mg by mouth every morning., Disp: , Rfl:  .  omeprazole (PRILOSEC) 20 MG capsule, Take 20 mg by mouth daily., Disp: , Rfl:  .  predniSONE (DELTASONE) 5 MG tablet, Take by mouth., Disp: , Rfl:  .  propranolol (INDERAL) 80 MG tablet, Take 80 mg by mouth 2 (two) times daily., Disp: , Rfl:  .  risperiDONE (RISPERDAL) 1 MG tablet, Take 1 tablet (1 mg total) by mouth at bedtime., Disp: 90 tablet, Rfl: 2 .  rosuvastatin (CRESTOR) 10 MG tablet, Take by mouth., Disp: , Rfl:  .  buPROPion (WELLBUTRIN XL) 150 MG 24 hr tablet, Take 1 tablet (150 mg total) by mouth daily., Disp: 90 tablet, Rfl: 2 Medication Side Effects: none  Family Medical/ Social History: Changes? No  MENTAL HEALTH EXAM:  There were no vitals taken for this visit.There is no height or weight on file to calculate BMI.  General Appearance: Casual and Well Groomed moon-shape of face  Eye Contact:  Good  Speech:  Clear and Coherent  Volume:  Normal  Mood:  Euthymic  Affect:  Appropriate  Thought Process:  Goal Directed  Orientation:  Full (Time, Place, and Person)  Thought Content: Logical   Suicidal Thoughts:  No  Homicidal Thoughts:  No  Memory:  WNL  Judgement:  Good  Insight:  Good   Psychomotor Activity:  Normal  Concentration:  Concentration: Good  Recall:  Good  Fund of Knowledge: Good  Language: Good  Assets:  Desire for Improvement  ADL's:  Intact  Cognition: WNL  Prognosis:  Good    DIAGNOSES:    ICD-10-CM   1. Generalized anxiety disorder F41.1   2. Bipolar I disorder ([place]) F31.9     Receiving Psychotherapy: No    RECOMMENDATIONS: I have briefly reviewed his records from [place], the notes from this year from radiation and medical oncology.  With the quick scan of these notes, I have not been able to clear out why a decrease in Wellbutrin was recommended.  However, the patient is doing fine and has no symptoms of depression so we agree that decreasing Wellbutrin is okay.  Philip Garza states he would like to get off of all of the psych medications but he is a little scared to.  He is done well on them for a long time.  The patient will call if he has any worsening symptoms of depression including anhedonia, decreased energy or motivation, wanting to sleep all the time, or isolating. Decrease Wellbutrin XL to 150 mg p.o. daily. Continue Risperdal 1 mg nightly. Continue Klonopin 0.5 mg 1/2-1 twice daily as needed anxiety. Return in 9 months which is our usual schedule, or sooner as needed.  Donnal Moat, PA-C

## 2019-03-28 ENCOUNTER — Telehealth: Payer: Self-pay | Admitting: Physician Assistant

## 2019-03-28 ENCOUNTER — Other Ambulatory Visit: Payer: Self-pay | Admitting: Physician Assistant

## 2019-03-28 MED ORDER — ALPRAZOLAM 0.5 MG PO TABS: 0.2500 mg | ORAL_TABLET | Freq: Four times a day (QID) | ORAL | 0 refills | Status: AC | PRN

## 2019-03-28 MED ORDER — RISPERIDONE 2 MG PO TABS: 2.0000 mg | ORAL_TABLET | Freq: Every day | ORAL | Status: AC

## 2019-03-28 MED ORDER — HYDROXYZINE HCL 25 MG PO TABS: 25.0000 mg | ORAL_TABLET | Freq: Three times a day (TID) | ORAL | 0 refills | Status: AC | PRN

## 2019-03-28 NOTE — Telephone Encounter (Signed)
Pt called on 03/26/19.  More anxious.  Unable to sleep.  Racing thoughts.  'A lot going on w/ cancer, and in my life in general.  And all the coronavirus stuff.'  Increased Risperal to 2 mg qhs.  Added Hydroxyzine 25mg  tid prn.  Called early this am.03/28/19.  Hydroxyzine helps for about 4 hr but wakes up at night, mind races and can't go back to sleep.  Has general sense of worry, uneasiness throughout the day. Occas PA.  Is on Prednisone qd (per Onc) bid, but been on it several months now.  Called in Xanax 0.5mg  #20, 1/2-1 q6h prn. No RF

## 2019-03-29 ENCOUNTER — Emergency Department (HOSPITAL_COMMUNITY): Payer: BLUE CROSS/BLUE SHIELD

## 2019-03-29 ENCOUNTER — Inpatient Hospital Stay (HOSPITAL_COMMUNITY): Payer: BLUE CROSS/BLUE SHIELD

## 2019-03-29 ENCOUNTER — Inpatient Hospital Stay (HOSPITAL_COMMUNITY)
Admission: EM | Admit: 2019-03-29 | Discharge: 2019-04-17 | DRG: 917 | Disposition: E | Payer: BLUE CROSS/BLUE SHIELD | Attending: Critical Care Medicine | Admitting: Critical Care Medicine

## 2019-03-29 DIAGNOSIS — J690 Pneumonitis due to inhalation of food and vomit: Secondary | ICD-10-CM | POA: Diagnosis present

## 2019-03-29 DIAGNOSIS — S80212A Abrasion, left knee, initial encounter: Secondary | ICD-10-CM | POA: Diagnosis present

## 2019-03-29 DIAGNOSIS — S80211A Abrasion, right knee, initial encounter: Secondary | ICD-10-CM | POA: Diagnosis present

## 2019-03-29 DIAGNOSIS — D649 Anemia, unspecified: Secondary | ICD-10-CM | POA: Diagnosis present

## 2019-03-29 DIAGNOSIS — Z452 Encounter for adjustment and management of vascular access device: Secondary | ICD-10-CM

## 2019-03-29 DIAGNOSIS — R402212 Coma scale, best verbal response, none, at arrival to emergency department: Secondary | ICD-10-CM | POA: Diagnosis present

## 2019-03-29 DIAGNOSIS — Z79899 Other long term (current) drug therapy: Secondary | ICD-10-CM

## 2019-03-29 DIAGNOSIS — R402112 Coma scale, eyes open, never, at arrival to emergency department: Secondary | ICD-10-CM | POA: Diagnosis present

## 2019-03-29 DIAGNOSIS — C7951 Secondary malignant neoplasm of bone: Secondary | ICD-10-CM | POA: Diagnosis present

## 2019-03-29 DIAGNOSIS — Z8546 Personal history of malignant neoplasm of prostate: Secondary | ICD-10-CM | POA: Diagnosis not present

## 2019-03-29 DIAGNOSIS — R6520 Severe sepsis without septic shock: Secondary | ICD-10-CM | POA: Diagnosis present

## 2019-03-29 DIAGNOSIS — Z8249 Family history of ischemic heart disease and other diseases of the circulatory system: Secondary | ICD-10-CM

## 2019-03-29 DIAGNOSIS — T68XXXA Hypothermia, initial encounter: Secondary | ICD-10-CM | POA: Diagnosis present

## 2019-03-29 DIAGNOSIS — R402312 Coma scale, best motor response, none, at arrival to emergency department: Secondary | ICD-10-CM | POA: Diagnosis present

## 2019-03-29 DIAGNOSIS — G4733 Obstructive sleep apnea (adult) (pediatric): Secondary | ICD-10-CM | POA: Diagnosis present

## 2019-03-29 DIAGNOSIS — J9600 Acute respiratory failure, unspecified whether with hypoxia or hypercapnia: Secondary | ICD-10-CM

## 2019-03-29 DIAGNOSIS — K219 Gastro-esophageal reflux disease without esophagitis: Secondary | ICD-10-CM | POA: Diagnosis present

## 2019-03-29 DIAGNOSIS — F319 Bipolar disorder, unspecified: Secondary | ICD-10-CM | POA: Diagnosis present

## 2019-03-29 DIAGNOSIS — I469 Cardiac arrest, cause unspecified: Secondary | ICD-10-CM | POA: Diagnosis not present

## 2019-03-29 DIAGNOSIS — E875 Hyperkalemia: Secondary | ICD-10-CM | POA: Diagnosis present

## 2019-03-29 DIAGNOSIS — R6521 Severe sepsis with septic shock: Secondary | ICD-10-CM | POA: Diagnosis present

## 2019-03-29 DIAGNOSIS — T50902A Poisoning by unspecified drugs, medicaments and biological substances, intentional self-harm, initial encounter: Secondary | ICD-10-CM

## 2019-03-29 DIAGNOSIS — Y9279 Other farm location as the place of occurrence of the external cause: Secondary | ICD-10-CM

## 2019-03-29 DIAGNOSIS — I4510 Unspecified right bundle-branch block: Secondary | ICD-10-CM | POA: Diagnosis present

## 2019-03-29 DIAGNOSIS — F411 Generalized anxiety disorder: Secondary | ICD-10-CM | POA: Diagnosis present

## 2019-03-29 DIAGNOSIS — G9341 Metabolic encephalopathy: Secondary | ICD-10-CM | POA: Diagnosis present

## 2019-03-29 DIAGNOSIS — I10 Essential (primary) hypertension: Secondary | ICD-10-CM | POA: Diagnosis present

## 2019-03-29 DIAGNOSIS — F419 Anxiety disorder, unspecified: Secondary | ICD-10-CM | POA: Diagnosis present

## 2019-03-29 DIAGNOSIS — T424X2A Poisoning by benzodiazepines, intentional self-harm, initial encounter: Principal | ICD-10-CM | POA: Diagnosis present

## 2019-03-29 DIAGNOSIS — Z515 Encounter for palliative care: Secondary | ICD-10-CM | POA: Diagnosis not present

## 2019-03-29 DIAGNOSIS — J9601 Acute respiratory failure with hypoxia: Secondary | ICD-10-CM

## 2019-03-29 DIAGNOSIS — E785 Hyperlipidemia, unspecified: Secondary | ICD-10-CM | POA: Diagnosis present

## 2019-03-29 DIAGNOSIS — Z66 Do not resuscitate: Secondary | ICD-10-CM | POA: Diagnosis not present

## 2019-03-29 DIAGNOSIS — A419 Sepsis, unspecified organism: Secondary | ICD-10-CM | POA: Diagnosis present

## 2019-03-29 DIAGNOSIS — F329 Major depressive disorder, single episode, unspecified: Secondary | ICD-10-CM | POA: Diagnosis present

## 2019-03-29 DIAGNOSIS — Z7952 Long term (current) use of systemic steroids: Secondary | ICD-10-CM

## 2019-03-29 LAB — TSH: TSH: 2.349 u[IU]/mL (ref 0.350–4.500)

## 2019-03-29 LAB — CBC WITH DIFFERENTIAL/PLATELET
Abs Immature Granulocytes: 0 10*3/uL (ref 0.00–0.07)
Basophils Absolute: 0 10*3/uL (ref 0.0–0.1)
Basophils Relative: 0 %
Eosinophils Absolute: 0 10*3/uL (ref 0.0–0.5)
Eosinophils Relative: 0 %
HCT: 50.4 % (ref 39.0–52.0)
Hemoglobin: 16.3 g/dL (ref 13.0–17.0)
Lymphocytes Relative: 2 %
Lymphs Abs: 1.1 10*3/uL (ref 0.7–4.0)
MCH: 32.3 pg (ref 26.0–34.0)
MCHC: 32.3 g/dL (ref 30.0–36.0)
MCV: 99.8 fL (ref 80.0–100.0)
Monocytes Absolute: 5.7 10*3/uL — ABNORMAL HIGH (ref 0.1–1.0)
Monocytes Relative: 10 %
Neutro Abs: 49.7 10*3/uL — ABNORMAL HIGH (ref 1.7–7.7)
Neutrophils Relative %: 88 %
Platelets: 198 10*3/uL (ref 150–400)
RBC: 5.05 MIL/uL (ref 4.22–5.81)
RDW: 13.7 % (ref 11.5–15.5)
WBC: 56.5 10*3/uL (ref 4.0–10.5)
nRBC: 0.1 % (ref 0.0–0.2)
nRBC: 1 /100 WBC — ABNORMAL HIGH

## 2019-03-29 LAB — POCT I-STAT 7, (LYTES, BLD GAS, ICA,H+H)
Acid-base deficit: 24 mmol/L — ABNORMAL HIGH (ref 0.0–2.0)
Bicarbonate: 7.5 mmol/L — ABNORMAL LOW (ref 20.0–28.0)
Calcium, Ion: 1.15 mmol/L (ref 1.15–1.40)
HCT: 44 % (ref 39.0–52.0)
Hemoglobin: 15 g/dL (ref 13.0–17.0)
O2 Saturation: 100 %
Patient temperature: 86.5
Potassium: 5.6 mmol/L — ABNORMAL HIGH (ref 3.5–5.1)
Sodium: 140 mmol/L (ref 135–145)
TCO2: 9 mmol/L — ABNORMAL LOW (ref 22–32)
pCO2 arterial: 25.7 mmHg — ABNORMAL LOW (ref 32.0–48.0)
pH, Arterial: 7.026 — CL (ref 7.350–7.450)
pO2, Arterial: 506 mmHg — ABNORMAL HIGH (ref 83.0–108.0)

## 2019-03-29 LAB — MRSA PCR SCREENING: MRSA by PCR: NEGATIVE

## 2019-03-29 LAB — COMPREHENSIVE METABOLIC PANEL
ALT: 35 U/L (ref 0–44)
AST: 40 U/L (ref 15–41)
Albumin: 4 g/dL (ref 3.5–5.0)
Alkaline Phosphatase: 65 U/L (ref 38–126)
Anion gap: 32 — ABNORMAL HIGH (ref 5–15)
BUN: 55 mg/dL — ABNORMAL HIGH (ref 8–23)
CO2: 9 mmol/L — ABNORMAL LOW (ref 22–32)
Calcium: 9.4 mg/dL (ref 8.9–10.3)
Chloride: 102 mmol/L (ref 98–111)
Creatinine, Ser: 3.44 mg/dL — ABNORMAL HIGH (ref 0.61–1.24)
GFR calc Af Amer: 21 mL/min — ABNORMAL LOW (ref >60)
GFR calc non Af Amer: 18 mL/min — ABNORMAL LOW (ref >60)
Glucose, Bld: 162 mg/dL — ABNORMAL HIGH (ref 70–99)
Potassium: 6.4 mmol/L (ref 3.5–5.1)
Sodium: 143 mmol/L (ref 135–145)
Total Bilirubin: 0.8 mg/dL (ref 0.3–1.2)
Total Protein: 6.5 g/dL (ref 6.5–8.1)

## 2019-03-29 LAB — BASIC METABOLIC PANEL
Anion gap: 26 — ABNORMAL HIGH (ref 5–15)
BUN: 56 mg/dL — ABNORMAL HIGH (ref 8–23)
CO2: 10 mmol/L — ABNORMAL LOW (ref 22–32)
Calcium: 7.5 mg/dL — ABNORMAL LOW (ref 8.9–10.3)
Chloride: 107 mmol/L (ref 98–111)
Creatinine, Ser: 3.13 mg/dL — ABNORMAL HIGH (ref 0.61–1.24)
GFR calc Af Amer: 23 mL/min — ABNORMAL LOW (ref >60)
GFR calc non Af Amer: 20 mL/min — ABNORMAL LOW (ref >60)
Glucose, Bld: 260 mg/dL — ABNORMAL HIGH (ref 70–99)
Potassium: 5.5 mmol/L — ABNORMAL HIGH (ref 3.5–5.1)
Sodium: 143 mmol/L (ref 135–145)

## 2019-03-29 LAB — LACTIC ACID, PLASMA
Lactic Acid, Venous: >11 mmol/L (ref 0.5–1.9)
Lactic Acid, Venous: >11 mmol/L (ref 0.5–1.9)

## 2019-03-29 LAB — MAGNESIUM: Magnesium: 2.3 mg/dL (ref 1.7–2.4)

## 2019-03-29 LAB — CORTISOL: Cortisol, Plasma: 6.4 ug/dL

## 2019-03-29 LAB — URINALYSIS, ROUTINE W REFLEX MICROSCOPIC
Bilirubin Urine: NEGATIVE
Glucose, UA: NEGATIVE mg/dL
Ketones, ur: NEGATIVE mg/dL
Leukocytes,Ua: NEGATIVE
Nitrite: NEGATIVE
Protein, ur: 30 mg/dL — AB
Specific Gravity, Urine: 1.015 (ref 1.005–1.030)
pH: 5 (ref 5.0–8.0)

## 2019-03-29 LAB — LIPASE, BLOOD: Lipase: 89 U/L — ABNORMAL HIGH (ref 11–51)

## 2019-03-29 LAB — PROTIME-INR
INR: 1.2 (ref 0.8–1.2)
Prothrombin Time: 15.2 seconds (ref 11.4–15.2)

## 2019-03-29 LAB — RAPID URINE DRUG SCREEN, HOSP PERFORMED
Amphetamines: NOT DETECTED
Barbiturates: NOT DETECTED
Benzodiazepines: NOT DETECTED
Cocaine: NOT DETECTED
Opiates: NOT DETECTED
Tetrahydrocannabinol: NOT DETECTED

## 2019-03-29 LAB — CBG MONITORING, ED: Glucose-Capillary: 164 mg/dL — ABNORMAL HIGH (ref 70–99)

## 2019-03-29 LAB — ETHANOL: Alcohol, Ethyl (B): <10 mg/dL (ref <10)

## 2019-03-29 LAB — ACETAMINOPHEN LEVEL: Acetaminophen (Tylenol), Serum: <10 ug/mL — ABNORMAL LOW (ref 10–30)

## 2019-03-29 LAB — GLUCOSE, CAPILLARY: Glucose-Capillary: 195 mg/dL — ABNORMAL HIGH (ref 70–99)

## 2019-03-29 LAB — CK: Total CK: 1745 U/L — ABNORMAL HIGH (ref 49–397)

## 2019-03-29 LAB — TROPONIN I: Troponin I: 0.05 ng/mL (ref <0.03)

## 2019-03-29 LAB — SALICYLATE LEVEL: Salicylate Lvl: <7 mg/dL (ref 2.8–30.0)

## 2019-03-29 LAB — PHOSPHORUS: Phosphorus: 7.8 mg/dL — ABNORMAL HIGH (ref 2.5–4.6)

## 2019-03-29 MED ORDER — ACETAMINOPHEN 650 MG RE SUPP: 650.0000 mg | Freq: Four times a day (QID) | RECTAL | Status: DC | PRN

## 2019-03-29 MED ORDER — VASOPRESSIN 20 UNIT/ML IV SOLN
0.0300 [IU]/min | INTRAVENOUS | Status: DC
  Administered 2019-03-29: 16:00:00 0.03 [IU]/min via INTRAVENOUS
  Filled 2019-03-29: qty 2

## 2019-03-29 MED ORDER — SODIUM BICARBONATE 8.4 % IV SOLN
50.0000 meq | Freq: Once | INTRAVENOUS | Status: AC
  Administered 2019-03-29: 12:00:00 50 meq via INTRAVENOUS

## 2019-03-29 MED ORDER — PIPERACILLIN-TAZOBACTAM 3.375 G IVPB
3.3750 g | Freq: Three times a day (TID) | INTRAVENOUS | Status: DC
  Filled 2019-03-29 (×2): qty 50

## 2019-03-29 MED ORDER — SODIUM BICARBONATE 8.4 % IV SOLN
INTRAVENOUS | Status: AC
  Administered 2019-03-29: 16:00:00
  Filled 2019-03-29: qty 50

## 2019-03-29 MED ORDER — SODIUM BICARBONATE 8.4 % IV SOLN
INTRAVENOUS | Status: DC
  Filled 2019-03-29 (×4): qty 1000

## 2019-03-29 MED ORDER — SODIUM CHLORIDE 0.9 % IV BOLUS
1000.0000 mL | Freq: Once | INTRAVENOUS | Status: AC
  Administered 2019-03-29: 1000 mL via INTRAVENOUS

## 2019-03-29 MED ORDER — MORPHINE SULFATE (PF) 2 MG/ML IV SOLN
2.0000 mg | Freq: Once | INTRAVENOUS | Status: AC
  Administered 2019-03-29: 23:00:00 2 mg via INTRAVENOUS
  Filled 2019-03-29: qty 1

## 2019-03-29 MED ORDER — NOREPINEPHRINE 4 MG/250ML-% IV SOLN
INTRAVENOUS | Status: AC
  Administered 2019-03-29: 12:00:00
  Filled 2019-03-29: qty 250

## 2019-03-29 MED ORDER — SODIUM CHLORIDE 0.9 % IV BOLUS
1000.0000 mL | Freq: Once | INTRAVENOUS | Status: AC
  Administered 2019-03-29: 16:00:00 1000 mL via INTRAVENOUS

## 2019-03-29 MED ORDER — EPINEPHRINE PF 1 MG/ML IJ SOLN
0.5000 ug/min | INTRAVENOUS | Status: DC
  Administered 2019-03-29: 18:00:00 20 ug/min via INTRAVENOUS
  Filled 2019-03-29: qty 4

## 2019-03-29 MED ORDER — MIDAZOLAM HCL 2 MG/2ML IJ SOLN: 2.0000 mg | INTRAMUSCULAR | Status: DC | PRN

## 2019-03-29 MED ORDER — PIPERACILLIN-TAZOBACTAM 3.375 G IVPB 30 MIN: 3.3750 g | Freq: Once | INTRAVENOUS | Status: DC

## 2019-03-29 MED ORDER — SODIUM CHLORIDE 0.9 % IV SOLN
3.0000 g | Freq: Once | INTRAVENOUS | Status: DC
  Filled 2019-03-29: qty 3

## 2019-03-29 MED ORDER — SODIUM CHLORIDE 0.9 % IV SOLN: INTRAVENOUS | Status: DC | PRN

## 2019-03-29 MED ORDER — MORPHINE SULFATE (PF) 2 MG/ML IV SOLN
INTRAVENOUS | Status: AC
  Administered 2019-03-29: 2 mg via INTRAMUSCULAR
  Filled 2019-03-29: qty 1

## 2019-03-29 MED ORDER — CHLORHEXIDINE GLUCONATE 0.12% ORAL RINSE (MEDLINE KIT)
15.0000 mL | Freq: Two times a day (BID) | OROMUCOSAL | Status: DC
  Administered 2019-03-29: 15 mL via OROMUCOSAL

## 2019-03-29 MED ORDER — POLYVINYL ALCOHOL 1.4 % OP SOLN
1.0000 [drp] | Freq: Four times a day (QID) | OPHTHALMIC | Status: DC | PRN
  Filled 2019-03-29: qty 15

## 2019-03-29 MED ORDER — ORAL CARE MOUTH RINSE
15.0000 mL | OROMUCOSAL | Status: DC
  Administered 2019-03-29 (×2): 15 mL via OROMUCOSAL

## 2019-03-29 MED ORDER — DOCUSATE SODIUM 50 MG/5ML PO LIQD: 100.0000 mg | Freq: Two times a day (BID) | ORAL | Status: DC | PRN

## 2019-03-29 MED ORDER — INSULIN ASPART 100 UNIT/ML ~~LOC~~ SOLN
2.0000 [IU] | SUBCUTANEOUS | Status: DC
  Administered 2019-03-29 (×2): 4 [IU] via SUBCUTANEOUS

## 2019-03-29 MED ORDER — CALCIUM GLUCONATE 10 % IV SOLN
1.0000 g | Freq: Once | INTRAVENOUS | Status: AC
  Administered 2019-03-29: 14:00:00 1 g via INTRAVENOUS
  Filled 2019-03-29: qty 10

## 2019-03-29 MED ORDER — PANTOPRAZOLE SODIUM 40 MG PO PACK
40.0000 mg | PACK | ORAL | Status: DC
  Administered 2019-03-29: 14:00:00 40 mg
  Filled 2019-03-29 (×2): qty 20

## 2019-03-29 MED ORDER — DEXTROSE 50 % IV SOLN
1.0000 | Freq: Once | INTRAVENOUS | Status: AC
  Administered 2019-03-29: 14:00:00 50 mL via INTRAVENOUS
  Filled 2019-03-29: qty 50

## 2019-03-29 MED ORDER — NOREPINEPHRINE 4 MG/250ML-% IV SOLN
2.0000 ug/min | INTRAVENOUS | Status: DC
  Administered 2019-03-29: 35 ug/min via INTRAVENOUS
  Filled 2019-03-29: qty 500

## 2019-03-29 MED ORDER — ACETAMINOPHEN 325 MG PO TABS: 650.0000 mg | ORAL_TABLET | Freq: Four times a day (QID) | ORAL | Status: DC | PRN

## 2019-03-29 MED ORDER — INSULIN ASPART 100 UNIT/ML IV SOLN
5.0000 [IU] | Freq: Once | INTRAVENOUS | Status: AC
  Administered 2019-03-29: 14:00:00 5 [IU] via INTRAVENOUS

## 2019-03-29 MED ORDER — NOREPINEPHRINE 4 MG/250ML-% IV SOLN
0.0000 ug/min | INTRAVENOUS | Status: DC
  Administered 2019-03-29 (×3): 40 ug/min via INTRAVENOUS
  Filled 2019-03-29 (×2): qty 250

## 2019-03-29 MED ORDER — LACTATED RINGERS IV BOLUS
1000.0000 mL | Freq: Once | INTRAVENOUS | Status: AC
  Administered 2019-03-29: 12:00:00 1000 mL via INTRAVENOUS

## 2019-03-29 MED ORDER — ENOXAPARIN SODIUM 30 MG/0.3ML ~~LOC~~ SOLN
30.0000 mg | SUBCUTANEOUS | Status: DC
  Administered 2019-03-29: 14:00:00 30 mg via SUBCUTANEOUS
  Filled 2019-03-29 (×2): qty 0.3

## 2019-03-29 MED ORDER — SODIUM BICARBONATE 8.4 % IV SOLN
INTRAVENOUS | Status: DC
  Administered 2019-03-29: 14:00:00 via INTRAVENOUS
  Filled 2019-03-29 (×4): qty 150

## 2019-03-29 MED ORDER — HYDROCORTISONE NA SUCCINATE PF 100 MG IJ SOLR
50.0000 mg | Freq: Four times a day (QID) | INTRAMUSCULAR | Status: DC
  Administered 2019-03-29: 14:00:00 50 mg via INTRAVENOUS
  Filled 2019-03-29: qty 2

## 2019-03-29 MED ORDER — BISACODYL 10 MG RE SUPP: 10.0000 mg | Freq: Every day | RECTAL | Status: DC | PRN

## 2019-03-29 MED ORDER — SODIUM CHLORIDE 0.9 % IV SOLN
250.0000 mL | INTRAVENOUS | Status: DC
  Administered 2019-03-29 (×2): 250 mL via INTRAVENOUS

## 2019-03-29 MED ORDER — DEXTROSE 5 % IV SOLN: INTRAVENOUS | Status: DC

## 2019-03-29 MED ORDER — FENTANYL CITRATE (PF) 100 MCG/2ML IJ SOLN: 50.0000 ug | INTRAMUSCULAR | Status: DC | PRN

## 2019-03-30 NOTE — Progress Notes (Signed)
Pt expired at 2245. Pronounced by this RN and Bernette Redbird RN. No pulses felt over a period of one minute and no breath sounds auscultated in multiple lung fields over a period of one minute. Family present at time of withdrawal of care and pts passing. All belongings sent home with pts wife.

## 2019-04-03 LAB — CULTURE, BLOOD (ROUTINE X 2)
Culture: NO GROWTH
Culture: NO GROWTH
Special Requests: ADEQUATE

## 2019-04-17 NOTE — Progress Notes (Signed)
PCCM update  Spoke with Family Directly with wife in the presence of Chaplain and RN Answered her questions After our discussion a decision has been made to withdraw supportive care at this time and allow patient to pass comfortably  We provided her children with a link to camera in to see their father prior to this. I also discussed plan with patient's siblings and his mother in the presence of Chaplain and they also understand and agree  With the plan.  Marland Kitchen.Signed Dr Seward [facility]

## 2019-04-17 NOTE — ED Provider Notes (Addendum)
[facility] EMERGENCY DEPARTMENT Provider Note   CSN: [id number] Arrival date & time: 04-16-2019  1139    History   Chief Complaint Chief Complaint  Patient presents with  . unresponsive    HPI Philip Garza is a 63 y.o. male.     Level 5 caveat due to altered mental status.  The history is provided by the EMS personnel.  Altered Mental Status  Presenting symptoms: unresponsiveness   Severity:  Severe Episode history:  Unable to specify Timing:  Constant Progression:  Unchanged Chronicity:  New Context comment:  Per EMS, patient found with empty bottle next to him in his [place] his [place]law[place].  Had suicide note next to him.  Supposedly has prescription for Xanax.  Patient got 8 mg of Narcan with no change.  Agonal breathing and was intubated. No CPR.   Past Medical History:  Diagnosis Date  . Anemia   . Anxiety   . Bipolar disorder [facility]) 2008   paranoia/ hospitalized,  now sees psychiatrist  . Cancer [facility])    prostate  . GERD (gastroesophageal reflux disease)   . Hypertension   . Sleep apnea     Patient Active Problem List   Diagnosis Date Noted  . Acute respiratory failure ([place]) April 16, 2019  . GAD (generalized anxiety disorder) 12/29/2018  . Bipolar disorder, unspecified ([place]) 12/29/2018  . Prostate cancer ([place]) 04/19/2014    Past Surgical History:  Procedure Laterality Date  . LYMPHADENECTOMY Bilateral 04/19/2014   Procedure: LYMPHADENECTOMY;  Surgeon: [name], MD;  Location: [facility] ORS;  Service: Urology;  Laterality: Bilateral;  . ROBOT ASSISTED LAPAROSCOPIC RADICAL PROSTATECTOMY N/A 04/19/2014   Procedure: ROBOTIC ASSISTED LAPAROSCOPIC RADICAL PROSTATECTOMY LEVEL 2;  Surgeon: [name], MD;  Location: [facility] OR[facility];  Service: Urology;  Laterality: N/A;  . TYMPANOPLASTY Right 1976  . VASECTOMY          Home Medications    Prior to Admission medications   Medication Sig Start Date End Date Taking? Authorizing Provider   abiraterone acetate (ZYTIGA) 250 MG tablet Take by mouth. 01/18/19   [provider]  allopurinol (ZYLOPRIM) 300 MG tablet Take by mouth. 12/12/15   [provider]  ALPRAZolam Duanne Moron) 0.5 MG tablet Take 0.5-1 tablets (0.25-0.5 mg total) by mouth 4 (four) times daily as needed for anxiety. 03/28/19   Donnal Moat T, PA-C  b complex vitamins tablet Take 1 tablet by mouth daily.    [provider]  buPROPion (WELLBUTRIN XL) 150 MG 24 hr tablet Take 1 tablet (150 mg total) by mouth daily. 02/04/19   Donnal Moat T, PA-C  calcium-vitamin D (OSCAL WITH D) 500-200 MG-UNIT per tablet Take 1 tablet by mouth 2 (two) times daily.    [provider]  clonazePAM (KLONOPIN) 0.5 MG tablet TAKE 1/2 - 1 TABLET BY MOUTH TWICE A DAY AS NEEDED FOR ANXIETY 02/04/19   Donnal Moat T, PA-C  ferrous sulfate 325 (65 FE) MG tablet Take 325 mg by mouth daily with breakfast.    [provider]  hydrOXYzine (ATARAX/VISTARIL) 25 MG tablet Take 1 tablet (25 mg total) by mouth every 8 (eight) hours as needed. 03/28/19   Donnal Moat T, PA-C  Leuprolide Acetate, 6 Month, (LUPRON DEPOT, 69-MONTH, IM) Inject into the muscle.    [provider]  lisinopril (PRINIVIL,ZESTRIL) 20 MG tablet Take 20 mg by mouth every morning.    [provider]  omeprazole (PRILOSEC) 20 MG capsule Take 20 mg by mouth daily.  [provider]  predniSONE (DELTASONE) 5 MG tablet Take by mouth. 01/18/19   [provider]  propranolol (INDERAL) 80 MG tablet Take 80 mg by mouth 2 (two) times daily.    [provider]  risperiDONE (RISPERDAL) 2 MG tablet Take 1 tablet (2 mg total) by mouth at bedtime. 03/28/19   Donnal Moat T, PA-C  rosuvastatin (CRESTOR) 10 MG tablet Take by mouth. 04/25/18   [provider]    Family History No family history on file.  Social History Social History   Tobacco Use  . Smoking status: Never Smoker  . Smokeless tobacco: Never  Used  Substance Use Topics  . Alcohol use: No  . Drug use: No     Allergies   Patient has no known allergies.   Review of Systems Review of Systems  Unable to perform ROS: Acuity of condition     Physical Exam Updated Vital Signs  ED Triage Vitals  Enc Vitals Group     BP 2019/04/03 1145 132/72     Pulse Rate 2019/04/03 1145 62     Resp 03-Apr-2019 1145 18     Temp 04/03/2019 1145 (!) 83.5 F (28.6 C)     Temp Source Apr 03, 2019 1145 Temporal     SpO2 Apr 03, 2019 1145 99 %     Weight --      Height --      Head Circumference --      Peak Flow --      Pain Score --      Pain Loc --      Pain Edu? --      Excl. in [place]? --     Physical Exam Constitutional:      General: He is in acute distress.     Appearance: He is ill-appearing and toxic-appearing.     Interventions: He is intubated.  HENT:     Head: Normocephalic and atraumatic.     Nose: Nose normal.     Mouth/Throat:     Mouth: Mucous membranes are dry.  Eyes:     Conjunctiva/sclera:     Right eye: Right conjunctiva is injected.     Left eye: Left conjunctiva is injected.     Pupils: Pupils are equal.     Right eye: Pupil is sluggish.     Left eye: Pupil is sluggish.  Neck:     Musculoskeletal: Neck supple. No neck rigidity.  Cardiovascular:     Pulses: Normal pulses.     Heart sounds: Normal heart sounds.  Pulmonary:     Effort: He is intubated.     Breath sounds: Normal breath sounds. No decreased breath sounds, wheezing or rhonchi.  Abdominal:     General: There is no distension.     Tenderness: There is no abdominal tenderness.  Musculoskeletal:        General: No deformity.     Right lower leg: No edema.     Left lower leg: No edema.  Skin:    Capillary Refill: Capillary refill takes less than 2 seconds.     Findings: Rash (abrasions to bilateral knees) present.  Neurological:     GCS: GCS eye subscore is 1. GCS verbal subscore is 1. GCS motor subscore is 1.      ED Treatments / Results  Labs  (all labs ordered are listed, but only abnormal results are displayed) Labs Reviewed  COMPREHENSIVE METABOLIC PANEL - Abnormal; Notable for the following components:  Result Value   Potassium 6.4 (*)    CO2 9 (*)    Glucose, Bld 162 (*)    BUN 55 (*)    Creatinine, Ser 3.44 (*)    GFR calc non Af Amer 18 (*)    GFR calc Af Amer 21 (*)    Anion gap 32 (*)    All other components within normal limits  CBC WITH DIFFERENTIAL/PLATELET - Abnormal; Notable for the following components:   WBC 56.5 (*)    Neutro Abs 49.7 (*)    Monocytes Absolute 5.7 (*)    nRBC 1 (*)    All other components within normal limits  LIPASE, BLOOD - Abnormal; Notable for the following components:   Lipase 89 (*)    All other components within normal limits  ACETAMINOPHEN LEVEL - Abnormal; Notable for the following components:   Acetaminophen (Tylenol), Serum <10 (*)    All other components within normal limits  TROPONIN I - Abnormal; Notable for the following components:   Troponin I 0.05 (*)    All other components within normal limits  CBG MONITORING, ED - Abnormal; Notable for the following components:   Glucose-Capillary 164 (*)    All other components within normal limits  POCT I-STAT 7, (LYTES, BLD GAS, ICA,H+H) - Abnormal; Notable for the following components:   pH, Arterial 7.026 (*)    pCO2 arterial 25.7 (*)    pO2, Arterial 506.0 (*)    Bicarbonate 7.5 (*)    TCO2 9 (*)    Acid-base deficit 24.0 (*)    Potassium 5.6 (*)    All other components within normal limits  CULTURE, BLOOD (ROUTINE X 2)  CULTURE, BLOOD (ROUTINE X 2)  CULTURE, RESPIRATORY  SALICYLATE LEVEL  ETHANOL  PROTIME-INR  RAPID URINE DRUG SCREEN, HOSP PERFORMED  URINALYSIS, ROUTINE W REFLEX MICROSCOPIC  LACTIC ACID, PLASMA  LACTIC ACID, PLASMA  TSH  CORTISOL  MAGNESIUM  PHOSPHORUS    EKG EKG Interpretation  Date/Time:  Apr 19, 2019 12:12:55 EDT Ventricular Rate:  60 PR Interval:    QRS Duration: 187  QT Interval:  517 QTC Calculation: 517 R Axis:   14 Text Interpretation:  Sinus rhythm Right bundle branch block Confirmed by Lennice Sites [phone]) on 04-19-19 12:15:06 PM   Radiology Ct Head Wo Contrast  Result Date: Apr 19, 2019 CLINICAL DATA:  Altered level of consciousness.  Prostate cancer. EXAM: CT HEAD WITHOUT CONTRAST TECHNIQUE: Contiguous axial images were obtained from the base of the skull through the vertex without intravenous contrast. COMPARISON:  None. FINDINGS: Brain: Ventricle size normal. 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 Mild hypodensity periventricular white matter on the left consistent with chronic infarct. Negative for acute infarct, hemorrhage, mass. Ventricle size normal. No midline shift. Vascular: Negative for hyperdense vessel Skull: Negative Sinuses/Orbits: Mild mucosal edema paranasal sinuses. Patient is intubated with NG tube. Negative orbit Other: None IMPRESSION: Left frontal white matter and subinsular hypodensity most consistent with chronic infarct. No acute abnormality. Electronically Signed   By: Franchot Gallo M.D.   On: Apr 19, 2019 13:17   Dg Chest Portable 1 View  Result Date: 04-19-19 CLINICAL DATA:  Intubation. Unspecified drug overdose. Current history of hypertension, anxiety and bipolar disorder. Personal history of prostate cancer metastatic to bone. EXAM: PORTABLE CHEST 1 VIEW COMPARISON:  PET/CT and CT chest 08/19/2014. Chest x-ray 04/08/2014. FINDINGS: Endotracheal tube tip in satisfactory position projecting approximately 5 cm above the carina. Gastric tube courses below the diaphragm into the stomach  with its tip in the fundus. Cardiac silhouette mildly enlarged for AP portable technique with interval increase in size since 2015. Mild streaky and patchy opacities involving the RIGHT lung base. Lungs otherwise clear. Pulmonary vascularity normal. No visible pleural effusions. IMPRESSION: 1. Support apparatus satisfactory. 2.  Bronchopneumonia versus aspiration pneumonia is suspected involving the RIGHT lung base. Electronically Signed   By: Evangeline Dakin M.D.   On: 2019/04/18 12:58    Procedures .Critical Care Performed by: Lennice Sites, DO Authorized by: Lennice Sites, DO   Critical care provider statement:    Critical care time (minutes):  64   Critical care was necessary to treat or prevent imminent or life-threatening deterioration of the following conditions:  Respiratory failure, shock and CNS failure or compromise   Critical care was time spent personally by me on the following activities:  Blood draw for specimens, development of treatment plan with patient or surrogate, discussions with primary provider, evaluation of patient's response to treatment, examination of patient, obtaining history from patient or surrogate, ordering and performing treatments and interventions, ordering and review of laboratory studies, ordering and review of radiographic studies, pulse oximetry, re-evaluation of patient's condition and review of old charts   I assumed direction of critical care for this patient from another provider in my specialty: no     (including critical care time)  Medications Ordered in ED Medications  dextrose 5 % 1,000 mL with sodium bicarbonate 150 mEq infusion (has no administration in time range)  pantoprazole sodium (PROTONIX) 40 mg/20 mL oral suspension 40 mg (has no administration in time range)  fentaNYL (SUBLIMAZE) injection 50 mcg (has no administration in time range)  midazolam (VERSED) injection 2 mg (has no administration in time range)  docusate (COLACE) 50 MG/5ML liquid 100 mg (has no administration in time range)  bisacodyl (DULCOLAX) suppository 10 mg (has no administration in time range)  insulin aspart (novoLOG) injection 2-6 Units (has no administration in time range)  chlorhexidine gluconate (MEDLINE KIT) (PERIDEX) 0.12 % solution 15 mL (has no administration in time range)   MEDLINE mouth rinse (has no administration in time range)  piperacillin-tazobactam (ZOSYN) IVPB 3.375 g (has no administration in time range)  0.9 %  sodium chloride infusion (has no administration in time range)  norepinephrine (LEVOPHED) 83m in 2564mpremix infusion (has no administration in time range)  hydrocortisone sodium succinate (SOLU-CORTEF) 100 MG injection 50 mg (has no administration in time range)  sodium bicarbonate 150 mEq in dextrose 5 % 1,000 mL infusion (has no administration in time range)  calcium gluconate inj 10% (1 g) URGENT USE ONLY! (has no administration in time range)  insulin aspart (novoLOG) injection 5 Units (has no administration in time range)    And  dextrose 50 % solution 50 mL (has no administration in time range)  piperacillin-tazobactam (ZOSYN) IVPB 3.375 g (has no administration in time range)  lactated ringers bolus 1,000 mL (1,000 mLs Intravenous New Bag/Given 03/21/01/20200)  norepinephrine (LEVOPHED) 4-5 MG/250ML-% infusion SOLN (  New Bag/Given 4/05-02-20212)  sodium bicarbonate injection 50 mEq (50 mEq Intravenous Given 4/May 02, 2020224)     Initial Impression / Assessment and Plan / ED Course  I have reviewed the triage vital signs and the nursing notes.  Pertinent labs & imaging results that were available during my care of the patient were reviewed by me and considered in my medical decision making (see chart for details).     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  Patient arrives hypothermic, intubated.  Patient found with empty bottle next to him in his [place] his [place].  Patient had been missing since yesterday afternoon.  He was incidentally found, when EMS was called out to the scene patient was found in his car, cold, empty pill bottle next to them.  Patient did not lose pulses.  He had agonal breathing and was intubated at the  scene.  Patient was given bicarb, 8 mg of Narcan without any change in his mental status.  Was given 500 cc bolus.  Patient does have prescription for benzodiazepine and other antidepressants.  Patient eventually became hypotensive in the ED after fluid boluses and was started on levophed.  Patient was started on a bear hugger, given warm IV fluids.  Was given additional bicarb given likely OD, started on bicarb infusion. Blood gas showed pH of 7.02.  Bicarb is 7.5.  PO2 506.  Patient with a white count of 56 likely from stress/possibly aspiration.  Will get blood cultures and give IV antibiotics to cover for aspiration.  ET tube is in place, good end-tidal CO2.  Chest x-ray shows good tube placement.  Will consider IV bicarb infusion.  Patient had normal blood sugar.  Intensive care has been consulted and will come down to the ED to evaluate the patient for further care.  I talked with the patient's wife on the phone who would like patient to continue to be full code.  Chest x-ray shows likely aspiration pneumonia.  Head CT showed no acute findings.  Patient with negative alcohol, salicylate, Tylenol level.  Patient hemodynamically stable on 10 of norepinephrine, intubated, on bicarb infusion.  Given IV Zosyn for antibiotics. Given insulin/dextrose/calcium for hyperK. EKG reassuring.  Will be admitted to the medicine ICU for further care. Suspect benzodiazepine OD with aspiration pneumonia.   This chart was dictated using voice recognition software.  Despite best efforts to proofread,  errors can occur which can change the documentation meaning.    Final Clinical Impressions(s) / ED Diagnoses   Final diagnoses:  Acute respiratory failure with hypoxia (HCC)  Aspiration pneumonia, unspecified aspiration pneumonia type, unspecified laterality, unspecified part of lung ([place])  Septic shock ([place])  Intentional drug overdose, initial encounter [facility])  Hyperkalemia    ED Discharge Orders    None        Lennice Sites, DO 2019-04-20 [place], [place], DO Apr 20, 2019 1340

## 2019-04-17 NOTE — Consult Note (Signed)
Responded to nurse's page, discussed status. Met with pt's wife and sister, offering prayer and emotional support. Concur with nurse that family is not realistic re: pt's prognosis. Will pass on to next chaplain for follow-up.  Rev. Eloise Levels Chaplain

## 2019-04-17 NOTE — Procedures (Signed)
PATIENT NAME: RYLEN SWINDLER MEDICAL RECORD NUMBER: [id number] Birthday: 1956-09-30  Age: 63 y.o. Admit Date: April 14, 2019  Provider: Brand Males   Indication: pea arrest  Technical Description:   CPR performance duration: approx 6 to  8 min  Was defibrillation or cardioversion used ? no  Was external pacer placed ? no  Was patient intubated pre/post CPR ? Pre crp  Was transvenous pacer placed ? no  Medications Administered Include      Yes/no Amiodarone no  Atropin no  Calcium no  Epinephrine X 3 +  Lidocaine no  Magnesium no  Norepinephrine Was on it at time of arrest  Phenylephrine no  Sodium bicarbonate Was on gtt and given 2 boluses  Vasopression Was on it   Evaluation  Final Status - Was patient successfully resuscitated ? yes  If successfully resuscitated - what is current rhythm ? Sinus but in shock If successfully resuscitated - what is current hemodynamic status ? Circulatory shock  Miscellaneous Information Dr Halford Chessman starting epi gtt     SIGNATURE    Dr. Brand Males, M.D., F.C.C.P,  Pulmonary and Critical Care Medicine Staff Physician, [place] Director - Interstitial Lung Disease  Program  Pulmonary [place] [facility] [place], [place], [address]  Pager: [phone], If no answer or between  15:00h - 7:00h: call [phone] Telephone: [phone]  5:35 PM Apr 14, 2019    Brand Males 28-Apr-20205:34 PM

## 2019-04-17 NOTE — Procedures (Signed)
Extubation Procedure Note  Patient Details:   Name: Philip Garza DOB: 07-09-1956 MRN: [id number]   Airway Documentation:    Vent end date: 04/07/2019 Vent end time: 2203   Evaluation  O2 sats: currently acceptable Complications: No apparent complications Patient did tolerate procedure well. Bilateral Breath Sounds: Rhonchi   Patient terminally extubated per MD order.  Catha Brow 2019-04-07, 10:06 PM

## 2019-04-17 NOTE — Progress Notes (Signed)
CDC referral started. The referral number is [phone].

## 2019-04-17 NOTE — Progress Notes (Signed)
PCCM NOTE  63 yr old w/ PMHx Prostate cancer with bone mets, Anxiety, Depression, HTN, OSA on CPAP, HLD, GERD s/p suicide attempt toxic ingestion of benzodiazepines. S/p CODE BLUE PEA @ 1722 was started on bicarb gtt. Levo gtt, and Vasopressin gtt 2 pushes Epi and 2 pushes of Bicarb  ROSC achieved at 1727 Pulse lost again at 1729--push Epi x 2 ROSC at 1731 sinus brady HR 48 BP 142/81  On my evaluation at 19:40  Pt continues on mechanical ventilation HR in 30 s. Pulse is thready Sat 77% despite FiO2 100% PEEP 5 BP- 102/53 Core temp 33.9 deg C  I discussed with wife and family regarding his dire clinical condition and that he is receiving full critical care support already on ACLS meds as specified above. His prognosis remains grim. RN was present for conversations. The patient's family expressed understanding.   Signed Dr Seward Carol Pulmonary Critical Care Locums

## 2019-04-17 NOTE — Procedures (Signed)
Central Venous Catheter Insertion Procedure Note Philip Garza [id number] Mar 22, 1956  Procedure: Insertion of Central Venous Catheter Indications: Assessment of intravascular volume, Drug and/or fluid administration and Frequent blood sampling  Procedure Details Consent: Risks of procedure as well as the alternatives and risks of each were explained to the (patient/caregiver).  Consent for procedure obtained. Time Out: Verified patient identification, verified procedure, site/side was marked, verified correct patient position, special equipment/implants available, medications/allergies/relevent history reviewed, required imaging and test results available.  Performed  Maximum sterile technique was used including antiseptics, cap, gloves, gown, hand hygiene, mask and sheet. Skin prep: Chlorhexidine; local anesthetic administered A antimicrobial bonded/coated triple lumen catheter was placed in the right internal jugular vein using the Seldinger technique using real-time US guidance. Secured at 14cm.   Evaluation Blood flow good Complications: No apparent complications Patient did tolerate procedure well. Chest X-ray ordered to verify placement.  CXR: pending.   Philip Apo, MD, PhD 03/30/19, 3:45 PM [place] [facility] [phone] or if no answer [phone]

## 2019-04-17 NOTE — Death Summary Note (Signed)
Chaplain referred to patient and family by Nurse Gerald Stabs this evening. Per nurse and Critical care physician [place], patient overdosed this afternoon/evening and is now actively dying with a need to have a conversation with family about removal of care and comfort care. Chaplain, nurse, and physician coordinated care this evening and assisted family by providing medical information, assistance with family dynamics and grief, emotional support, and spiritual support. Family decided to extubate and utilize comfort measures only. Patient passed shortly afterward with all family members on board and accepting. Chaplain provided prayer ritual at bedside.

## 2019-04-17 NOTE — Progress Notes (Signed)
Critical ABG Values given to Philip Sites, DO at 1211 on 03/31/19 by Otho Ket, RRT.   Patient was on PRVC, VT 580, f 20, 100% FIO2 and PEEP: 5

## 2019-04-17 NOTE — Consult Note (Signed)
ED called, said transported pt to 46M, suicidal. Initially advised family was in consultation rm wanting to speak to chaplain. 46M advised sister had called, no family was present. Invited nurse to extend chaplain's offer to speak with her by phone when she called again later. Will return sister's call if sister wishes to give nurse name and number tfor return call. Standing by.  Rev. Eloise Levels Chaplain

## 2019-04-17 NOTE — Progress Notes (Signed)
Pt transported to and from ED Trauma A to CT1 on ventilator. Pt stable throughout with no complications. VS within normal limits. RT will continue to monitor.

## 2019-04-17 NOTE — Discharge Summary (Signed)
DEATH SUMMARY   Patient Details  Name: Philip Garza MRN: [id number] DOB: Mar 16, 1956  Admission/Discharge Information   Admit Date:  04/07/2019  Date of Death: Date of Death: April 07, 2019  Time of Death: Time of Death: 03/19/2244  Length of Stay: 0  Referring Physician: Lonia Mad, MD   Reason(s) for Hospitalization  Intentional Overdose  Diagnoses  Preliminary cause of death:  Secondary Diagnoses (including complications and co-morbidities):  Active Problems:   Acute respiratory failure (HCC)   Cardiac arrest [facility])   Brief Hospital Course (including significant findings, care, treatment, and services provided and events leading to death)  Philip Garza is a 63 y.o. year old male w/ PMHx Prostate cancer with bone mets, Anxiety, Depression, HTN, OSA on CPAP, HLD, GERD from [place], last seen in PM of 03/28/19 [place] and was unresponsive.  Hypotensive, hypothermic.  Suicide note found with patient.  He is on chronic benzo's.  Was Intubated for airway protection. S/p CODE BLUE post admission PEA @ 1722 was started on bicarb gtt. Levo gtt, and Vasopressin gtt, received 2 pushes Epi and 2 pushes of Bicarb  ROSC achieved at 1727. Pulse lost again at 1729--push Epi x 2. Continued on Max doses of Vasopressin, levophed and Epinephrine and on mechanical ventilation. Despite these interventions pt has a HR in 30s with a very weak pulse. Extremely poor prognosis.  Discussed with family at length and decision made to withdraw from critical care interventions   Pertinent Labs and Studies  Significant Diagnostic Studies Dg Chest 1 View  Result Date: 04/07/2019 CLINICAL DATA:  Encounter for central line placement. EXAM: CHEST  1 VIEW COMPARISON:  Radiograph of same day. FINDINGS: Stable cardiomediastinal silhouette. Endotracheal and nasogastric tubes are unchanged in position. Right internal jugular catheter is noted with distal tip in expected position of the SVC. No  pneumothorax or pleural effusion is noted. Mild bilateral perihilar opacities are noted concerning for infiltrates or subsegmental atelectasis. Bony thorax is unremarkable. IMPRESSION: Endotracheal and nasogastric tubes are unchanged in position. Interval placement of right internal jugular catheter with tip in expected position of the SVC. No pneumothorax is noted. Mild bilateral perihilar opacities are noted concerning for infiltrates or possibly atelectasis. Electronically Signed   By: Marijo Conception, M.D.   On: 2019-04-07 16:16   Ct Head Wo Contrast  Result Date: 04/07/2019 CLINICAL DATA:  Altered level of consciousness.  Prostate cancer. EXAM: CT HEAD WITHOUT CONTRAST TECHNIQUE: Contiguous axial images were obtained from the base of the skull through the vertex without intravenous contrast. COMPARISON:  None. FINDINGS: Brain: Ventricle size normal. 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 Mild hypodensity periventricular white matter on the left consistent with chronic infarct. Negative for acute infarct, hemorrhage, mass. Ventricle size normal. No midline shift. Vascular: Negative for hyperdense vessel Skull: Negative Sinuses/Orbits: Mild mucosal edema paranasal sinuses. Patient is intubated with NG tube. Negative orbit Other: None IMPRESSION: Left frontal white matter and subinsular hypodensity most consistent with chronic infarct. No acute abnormality. Electronically Signed   By: Franchot Gallo M.D.   On: 04/07/2019 13:17   Dg Chest Portable 1 View  Result Date: 04-07-2019 CLINICAL DATA:  Intubation. Unspecified drug overdose. Current history of hypertension, anxiety and bipolar disorder. Personal history of prostate cancer metastatic to bone. EXAM: PORTABLE CHEST 1 VIEW COMPARISON:  PET/CT and CT chest 08/19/2014. Chest x-ray 04/08/2014. FINDINGS: Endotracheal tube tip in satisfactory position projecting approximately 5 cm above the carina. Gastric tube courses  below the  diaphragm into the stomach with its tip in the fundus. Cardiac silhouette mildly enlarged for AP portable technique with interval increase in size since 2015. Mild streaky and patchy opacities involving the RIGHT lung base. Lungs otherwise clear. Pulmonary vascularity normal. No visible pleural effusions. IMPRESSION: 1. Support apparatus satisfactory. 2. Bronchopneumonia versus aspiration pneumonia is suspected involving the RIGHT lung base. Electronically Signed   By: Evangeline Dakin M.D.   On: Apr 14, 2019 12:58    Microbiology Recent Results (from the past 240 hour(s))  MRSA PCR Screening     Status: None   Collection Time: 14-Apr-2019  2:15 PM  Result Value Ref Range Status   MRSA by PCR NEGATIVE NEGATIVE Final    Comment:        The GeneXpert MRSA Assay (FDA approved for NASAL specimens only), is one component of a comprehensive MRSA colonization surveillance program. It is not intended to diagnose MRSA infection nor to guide or monitor treatment for MRSA infections. Performed at [facility], [place] [address], [place], [place] [address]     Lab Basic Metabolic Panel: Recent Labs  Lab Apr 14, 2019 1158 04-14-19 1206 14-Apr-2019 1545 14-Apr-2019 1603  NA 143 140  --  143  K 6.4* 5.6*  --  5.5*  CL 102  --   --  107  CO2 9*  --   --  10*  GLUCOSE 162*  --   --  260*  BUN 55*  --   --  56*  CREATININE 3.44*  --   --  3.13*  CALCIUM 9.4  --   --  7.5*  MG  --   --  2.3  --   PHOS  --   --  7.8*  --    Liver Function Tests: Recent Labs  Lab 04-14-19 1158  AST 40  ALT 35  ALKPHOS 65  BILITOT 0.8  PROT 6.5  ALBUMIN 4.0   Recent Labs  Lab 04/14/19 1158  LIPASE 89*   No results for input(s): AMMONIA in the last 168 hours. CBC: Recent Labs  Lab 04/14/2019 1158 04/14/2019 1206  WBC 56.5*  --   NEUTROABS 49.7*  --   HGB 16.3 15.0  HCT 50.4 44.0  MCV 99.8  --   PLT 198  --    Cardiac Enzymes: Recent Labs  Lab 14-Apr-2019 1158 14-Apr-2019 1545  CKTOTAL  --  1,745*   TROPONINI 0.05*  --    Sepsis Labs: Recent Labs  Lab 04-14-19 1158 04/14/19 1545  WBC 56.5*  --   LATICACIDVEN >11.0* >11*    Procedures/Operations  Endotracheal intubation - in the field CVC in the RIJ Terminal extubation - 10:03 PM   Rise Paganini Dalesha Stanback April 14, 2019, 11:32 PM

## 2019-04-17 NOTE — Progress Notes (Signed)
Spoke with Dr. Halford Chessman via phone regarding SBP trending downward (60s-50s) whiole levophed at ceiling and the vasopressin running. Received order to bolus 1,000 mL 0.9% NS.

## 2019-04-17 NOTE — H&P (Signed)
NAME:  Philip Garza, MRN:  [id number], DOB:  1956/02/10, LOS: 0 ADMISSION DATE:  2019/04/21, CONSULTATION DATE:  Apr 21, 2019 REFERRING MD:  Dr. Ronnald Nian, ER, CHIEF COMPLAINT:  Altered mental status   Brief History   63 yo male from [place], last seen in PM of 03/28/19 [place] and was unresponsive.  Hypotensive, hypothermic.  Suicide note found with patient.  He is on chronic benzo's.  Intubated for airway protection.  History of present illness   63 yo male from [place], last seen in PM of 03/28/19 found [place] and was unresponsive.  Hypotensive, hypothermic.  Suicide note found with patient.  He is on chronic benzo's.  Intubated for airway protection.  Patient not able to provide history.  Past Medical History  Prostate cancer with bone mets, Anxiety, Depression, HTN, OSA on CPAP, HLD, GERD  Significant Hospital Events   4/12 Admit  Consults:    Procedures:  ETT 4/12 >>  Significant Diagnostic Tests:  CT head 4/12 >> Lt frontal hypodensity from chronic infarct  Micro Data:  Blood 4/12 >> Sputum 4/12 >>  Antimicrobials:  Zosyn 4/12 >>    Interim history/subjective:    Objective   Blood pressure (!) 97/59, pulse (!) 58, temperature (!) 83.5 F (28.6 C), temperature source Temporal, resp. rate 17, SpO2 100 %.    Vent Mode: PRVC FiO2 (%):  [40 %-100 %] 40 % Set Rate:  [20 bmp] 20 bmp Vt Set:  [580 mL] 580 mL PEEP:  [5 cmH20] 5 cmH20 Plateau Pressure:  [12 cmH20] 12 cmH20  No intake or output data in the 24 hours ending 04-21-19 1313 There were no vitals filed for this visit.  Examination:  General - warming blanket on Eyes - pupils mid point ENT - ETT in place Cardiac - regular rate/rhythm, no murmur Chest - b/l crackles Abdomen - soft, non tender, decreased bowel sounds Extremities - cool Skin - no rashes Neuro - unresponsive  Assessment & Plan:   Acute hypoxic respiratory failure with compromised airway.  Plan - full vent support - f/u CXR, ABG  Aspiration pneumonitis. Plan - zosyn - f/u cultures  Septic shock likely from pneumonia. Plan - pressors to keep MAP > 65 - continue IV fluids  Acute metabolic encephalopathy. Plan - monitor mental status - f/u UDS - might need further neuro assessment  Suicide attempt with hx of depression, anxiety. Plan - will need psych assessment if he recovers neuro status  Hx of prostate cancer with bone mets on chronic prednisone. Plan - add stress dose steroids  Hypothermia. Plan - check cortisol, TSH - external warming  Hx of HTN, HLD. Plan - hold outpt meds  Best practice:  Diet: NPO DVT prophylaxis: Lovenox GI prophylaxis: Protonix Mobility: Bed rest Code Status: Full code Family Communication: No family at bedside Disposition: ICU  Labs   CBC: Recent Labs  Lab 04-21-19 1158 2019-04-21 1206  WBC 56.5*  --   NEUTROABS 49.7*  --   HGB 16.3 15.0  HCT 50.4 44.0  MCV 99.8  --   PLT 198  --     Basic Metabolic Panel: Recent Labs  Lab 2019/04/21 1206  NA 140  K 5.6*   GFR: CrCl cannot be calculated (Patient's most recent lab result is older than the maximum 21 days allowed.). Recent Labs  Lab 04/21/2019 1158  WBC 56.5*    Liver Function Tests: No results for input(s): AST, ALT, ALKPHOS,  BILITOT, PROT, ALBUMIN in the last 168 hours. No results for input(s): LIPASE, AMYLASE in the last 168 hours. No results for input(s): AMMONIA in the last 168 hours.  ABG    Component Value Date/Time   PHART 7.026 (LL) Apr 20, 2019 1206   PCO2ART 25.7 (L) Apr 20, 2019 1206   PO2ART 506.0 (H) 04-20-2019 1206   HCO3 7.5 (L) 04-20-19 1206   TCO2 9 (L) 2019-04-20 1206   ACIDBASEDEF 24.0 (H) Apr 20, 2019 1206   O2SAT 100.0 04/20/2019 1206     Coagulation Profile: Recent Labs  Lab 04-20-2019 1254  INR 1.2    Cardiac Enzymes: No results for input(s): CKTOTAL, CKMB, CKMBINDEX, TROPONINI in the last 168 hours.  HbA1C: No  results found for: HGBA1C  CBG: Recent Labs  Lab 04-20-2019 1201  GLUCAP 164*    Review of Systems:   Unable to obtain  Past Medical History  He,  has a past medical history of Anemia, Anxiety, Bipolar disorder ([place]) (2008), Cancer ([place]), GERD (gastroesophageal reflux disease), Hypertension, and Sleep apnea.   Surgical History    Past Surgical History:  Procedure Laterality Date  . LYMPHADENECTOMY Bilateral 04/19/2014   Procedure: LYMPHADENECTOMY;  Surgeon: [name], MD;  Location: [facility];  Service: Urology;  Laterality: Bilateral;  . ROBOT ASSISTED LAPAROSCOPIC RADICAL PROSTATECTOMY N/A 04/19/2014   Procedure: ROBOTIC ASSISTED LAPAROSCOPIC RADICAL PROSTATECTOMY LEVEL 2;  Surgeon: [name], MD;  Location: [facility];  Service: Urology;  Laterality: N/A;  . TYMPANOPLASTY Right 1976  . VASECTOMY       Social History  Unable to obtain.  Family History   Unable to obtain.  Allergies No Known Allergies   Home Medications  Prior to Admission medications   Medication Sig Start Date End Date Taking? Authorizing Provider  abiraterone acetate (ZYTIGA) 250 MG tablet Take by mouth. 01/18/19   [provider]  allopurinol (ZYLOPRIM) 300 MG tablet Take by mouth. 12/12/15   [provider]  ALPRAZolam Duanne Moron) 0.5 MG tablet Take 0.5-1 tablets (0.25-0.5 mg total) by mouth 4 (four) times daily as needed for anxiety. 03/28/19   Donnal Moat T, PA-C  b complex vitamins tablet Take 1 tablet by mouth daily.    [provider]  buPROPion (WELLBUTRIN XL) 150 MG 24 hr tablet Take 1 tablet (150 mg total) by mouth daily. 02/04/19   Donnal Moat T, PA-C  calcium-vitamin D (OSCAL WITH D) 500-200 MG-UNIT per tablet Take 1 tablet by mouth 2 (two) times daily.    [provider]  clonazePAM (KLONOPIN) 0.5 MG tablet TAKE 1/2 - 1 TABLET BY MOUTH TWICE A DAY AS NEEDED FOR ANXIETY 02/04/19   Donnal Moat T, PA-C  ferrous sulfate 325 (65 FE) MG tablet Take 325 mg by mouth daily  with breakfast.    [provider]  hydrOXYzine (ATARAX/VISTARIL) 25 MG tablet Take 1 tablet (25 mg total) by mouth every 8 (eight) hours as needed. 03/28/19   Donnal Moat T, PA-C  Leuprolide Acetate, 6 Month, (LUPRON DEPOT, 40-MONTH, IM) Inject into the muscle.    [provider]  lisinopril (PRINIVIL,ZESTRIL) 20 MG tablet Take 20 mg by mouth every morning.    [provider]  omeprazole (PRILOSEC) 20 MG capsule Take 20 mg by mouth daily.    [provider]  predniSONE (DELTASONE) 5 MG tablet Take by mouth. 01/18/19   [provider]  propranolol (INDERAL) 80 MG tablet Take 80 mg by mouth 2 (two) times daily.    [provider]  risperiDONE (  RISPERDAL) 2 MG tablet Take 1 tablet (2 mg total) by mouth at bedtime. 03/28/19   Donnal Moat T, PA-C  rosuvastatin (CRESTOR) 10 MG tablet Take by mouth. 04/25/18   [provider]     Critical care time: 29 minutes    Chesley Mires, MD [place] Apr 28, 2019, 1:17 PM

## 2019-04-17 NOTE — ED Notes (Signed)
ED TO INPATIENT HANDOFF REPORT  ED Nurse Name and Phone #: Liz/Maggie [phone]  S Name/Age/Gender Philip Garza 63 y.o. male Room/Bed: TRAAC/TRAAC  Code Status   Code Status: Full Code  Home/SNF/Other Home Patient oriented to: UTA Is this baseline? No   Triage Complete: Triage complete  Chief Complaint unresponisve  Triage Note Pt brought in by [facility] with complaints of being found unresponsive. Reports pt had not been seen since 03/28/2019 @ 1600. When EMS arrived on scene patient GCS of 3 and intubation performed in the field.  EMS gave 8 of narcan and 50 Bi-carb, 500NS Only access at this time is left tib IO  Family owns large acres of land and went looking for patient. Suicide note found near pt    Allergies No Known Allergies  Level of Care/Admitting Diagnosis ED Disposition    ED Disposition Condition [facility]: [place] [[id number]]  Level of Care: ICU [6]  Diagnosis: Acute respiratory failure ([place]) [518.81.ICD-9-CM]  Admitting Physician: Chesley Mires [[id number]]  Attending Physician: Chesley Mires [[id number]]  Estimated length of stay: 5 - 7 days  Certification:: I certify this patient will need inpatient services for at least 2 midnights  PT Class (Do Not Modify): Inpatient [101]  PT Acc Code (Do Not Modify): Private [1]       B Medical/Surgery History Past Medical History:  Diagnosis Date  . Anemia   . Anxiety   . Bipolar disorder [facility]) 2008   paranoia/ hospitalized,  now sees psychiatrist  . Cancer [facility])    prostate  . GERD (gastroesophageal reflux disease)   . Hypertension   . Sleep apnea    Past Surgical History:  Procedure Laterality Date  . LYMPHADENECTOMY Bilateral 04/19/2014   Procedure: LYMPHADENECTOMY;  Surgeon: [name], MD;  Location[facility];  Service: Urology;  Laterality: Bilateral;  . ROBOT ASSISTED LAPAROSCOPIC RADICAL PROSTATECTOMY N/A 04/19/2014   Procedure: ROBOTIC ASSISTED LAPAROSCOPIC RADICAL  PROSTATECTOMY LEVEL 2;  Surgeon: [name], MD;  Location: [facility];  Service: Urology;  Laterality: N/A;  . TYMPANOPLASTY Right 1976  . VASECTOMY       A IV Location/Drains/Wounds Patient Lines/Drains/Airways Status   Active Line/Drains/Airways    Name:   Placement date:   Placement time:   Site:   Days:   Peripheral IV 04/05/19 Left Hand   05-Apr-2019    1200    Hand   less than 1   Peripheral IV 04-05-2019 Right Forearm   04-05-2019    1219    Forearm   less than 1   NG/OG Tube Nasogastric 14 Fr. Left nare Aucultation;Xray Measured external length of tube   04-05-2019    1210    Left nare   less than 1   Urethral Catheter Jene Every RN Latex;Temperature probe 16 Fr.   April 05, 2019    1223    Latex;Temperature probe   less than 1   Airway 7 mm   05-Apr-2019    1100     less than 1   Incision - 6 Ports Abdomen 1: Right;Lower;Lateral 2: Right;Mid;Lateral 3: Right;Upper;Lateral 4: Umbilicus;Upper 5: Left;Mid;Lateral 6: Left;Lower;Lateral   04/19/14    -     1805          Intake/Output Last 24 hours No intake or output data in the 24 hours ending Apr 05, 2019 1316  Labs/Imaging Results for orders placed or performed during the hospital encounter of April 05, 2019 (from the past 48 hour(s))  CBC with Diff     Status: Abnormal   Collection Time: 04-09-2019 11:58 AM  Result Value Ref Range   WBC 56.5 (HH) 4.0 - 10.5 K/uL    Comment: REPEATED TO VERIFY THIS CRITICAL RESULT HAS VERIFIED AND BEEN CALLED TO KATIE RAND RN BY KAY WOOLLEN ON 04 12 2020 AT 73, AND HAS BEEN READ BACK.     RBC 5.05 4.22 - 5.81 MIL/uL   Hemoglobin 16.3 13.0 - 17.0 g/dL   HCT 50.4 39.0 - 52.0 %   MCV 99.8 80.0 - 100.0 fL   MCH 32.3 26.0 - 34.0 pg   MCHC 32.3 30.0 - 36.0 g/dL   RDW 13.7 11.5 - 15.5 %   Platelets 198 150 - 400 K/uL   nRBC 0.1 0.0 - 0.2 %   Neutrophils Relative % 88 %   Neutro Abs 49.7 (H) 1.7 - 7.7 K/uL   Lymphocytes Relative 2 %   Lymphs Abs 1.1 0.7 - 4.0 K/uL   Monocytes Relative 10 %   Monocytes Absolute 5.7  (H) 0.1 - 1.0 K/uL   Eosinophils Relative 0 %   Eosinophils Absolute 0.0 0.0 - 0.5 K/uL   Basophils Relative 0 %   Basophils Absolute 0.0 0.0 - 0.1 K/uL   nRBC 1 (H) 0 /100 WBC   Abs Immature Granulocytes 0.00 0.00 - 0.07 K/uL    Comment: Performed at [place] [address], [place], [place] [address]  CBG monitoring, ED     Status: Abnormal   Collection Time: 04/09/19 12:01 PM  Result Value Ref Range   Glucose-Capillary 164 (H) 70 - 99 mg/dL  I-STAT 7, (LYTES, BLD GAS, ICA, H+H)     Status: Abnormal   Collection Time: 04/09/19 12:06 PM  Result Value Ref Range   pH, Arterial 7.026 (LL) 7.350 - 7.450   pCO2 arterial 25.7 (L) 32.0 - 48.0 mmHg   pO2, Arterial 506.0 (H) 83.0 - 108.0 mmHg   Bicarbonate 7.5 (L) 20.0 - 28.0 mmol/L   TCO2 9 (L) 22 - 32 mmol/L   O2 Saturation 100.0 %   Acid-base deficit 24.0 (H) 0.0 - 2.0 mmol/L   Sodium 140 135 - 145 mmol/L   Potassium 5.6 (H) 3.5 - 5.1 mmol/L   Calcium, Ion 1.15 1.15 - 1.40 mmol/L   HCT 44.0 39.0 - 52.0 %   Hemoglobin 15.0 13.0 - 17.0 g/dL   Patient temperature 86.5 F    Sample type ARTERIAL    Comment NOTIFIED PHYSICIAN   Protime-INR     Status: None   Collection Time: 2019-04-09 12:54 PM  Result Value Ref Range   Prothrombin Time 15.2 11.4 - 15.2 seconds   INR 1.2 0.8 - 1.2    Comment: (NOTE) INR goal varies based on device and disease states. Performed at [facility], [place] [address] [place], [place] [address]    Dg Chest Portable 1 View  Result Date: 2019/04/09 CLINICAL DATA:  Intubation. Unspecified drug overdose. Current history of hypertension, anxiety and bipolar disorder. Personal history of prostate cancer metastatic to bone. EXAM: PORTABLE CHEST 1 VIEW COMPARISON:  PET/CT and CT chest 08/19/2014. Chest x-ray 04/08/2014. FINDINGS: Endotracheal tube tip in satisfactory position projecting approximately 5 cm above the carina. Gastric tube courses below the diaphragm into the stomach with its tip in the  fundus. Cardiac silhouette mildly enlarged for AP portable technique with interval increase in size since 2015. Mild streaky and patchy opacities involving the RIGHT lung base.  Lungs otherwise clear. Pulmonary vascularity normal. No visible pleural effusions. IMPRESSION: 1. Support apparatus satisfactory. 2. Bronchopneumonia versus aspiration pneumonia is suspected involving the RIGHT lung base. Electronically Signed   By: Evangeline Dakin M.D.   On: Apr 23, 2019 12:58    Pending Labs Unresulted Labs (From admission, onward)    Start     Ordered   03/30/19 0500  Comprehensive metabolic panel  Tomorrow morning,   R     Apr 23, 2019 1311   03/30/19 0500  CBC  Tomorrow morning,   R     2019/04/23 1311   03/30/19 0500  Blood gas, arterial  Tomorrow morning,   R     23-Apr-2019 1311   Apr 23, 2019 1315  Cortisol  Once,   R     2019-04-23 1315   04-23-19 1309  TSH  Once,   R     Apr 23, 2019 1309   04/23/19 1308  Culture, respiratory (non-expectorated)  Once,   R     04-23-2019 1307   04/23/2019 1302  Blood culture (routine x 2)  BLOOD CULTURE X 2,   STAT     04/23/19 1302   04/23/2019 1202  Acetaminophen level  ONCE - STAT,   STAT     2019/04/23 1201   38/18/29 9371  Salicylate level  ONCE - STAT,   STAT     04/23/2019 1201   04/23/2019 1202  Ethanol  Once,   R     Apr 23, 2019 1202   Apr 23, 2019 1201  Lactic acid, plasma  Now then every 2 hours,   STAT     04/23/2019 1200   23-Apr-2019 1158  Troponin I -  Once,   R     04-23-19 1158   04-23-2019 1147  Lipase, blood  ONCE - STAT,   STAT     Apr 23, 2019 1146   04-23-19 1146  Comprehensive metabolic panel  ONCE - STAT,   STAT     04-23-19 1146   04/23/2019 1146  Urine rapid drug screen (hosp performed)  ONCE - STAT,   STAT     23-Apr-2019 1146   23-Apr-2019 1146  Urinalysis, Routine w reflex microscopic  Once,   R     Apr 23, 2019 1146          Vitals/Pain Today's Vitals   Apr 23, 2019 1233 2019-04-23 1240 23-Apr-2019 1256 2019/04/23 1258  BP:    (!) 97/59  Pulse:    (!) 58  Resp:    17  Temp:       TempSrc:      SpO2: 100% 100% 100% 100%    Isolation Precautions No active isolations  Medications Medications  dextrose 5 % 1,000 mL with sodium bicarbonate 150 mEq infusion (has no administration in time range)  pantoprazole sodium (PROTONIX) 40 mg/20 mL oral suspension 40 mg (has no administration in time range)  fentaNYL (SUBLIMAZE) injection 50 mcg (has no administration in time range)  midazolam (VERSED) injection 2 mg (has no administration in time range)  docusate (COLACE) 50 MG/5ML liquid 100 mg (has no administration in time range)  bisacodyl (DULCOLAX) suppository 10 mg (has no administration in time range)  insulin aspart (novoLOG) injection 2-6 Units (has no administration in time range)  chlorhexidine gluconate (MEDLINE KIT) (PERIDEX) 0.12 % solution 15 mL (has no administration in time range)  MEDLINE mouth rinse (has no administration in time range)  piperacillin-tazobactam (ZOSYN) IVPB 3.375 g (has no administration in time range)  0.9 %  sodium chloride infusion (has no administration in  time range)  norepinephrine (LEVOPHED) 67m in 2547mpremix infusion (has no administration in time range)  hydrocortisone sodium succinate (SOLU-CORTEF) 100 MG injection 50 mg (has no administration in time range)  lactated ringers bolus 1,000 mL (1,000 mLs Intravenous New Bag/Given 4/Apr 16, 2020200)  norepinephrine (LEVOPHED) 4-5 MG/250ML-% infusion SOLN (  New Bag/Given 03/2019/04/16212)  sodium bicarbonate injection 50 mEq (50 mEq Intravenous Given 03/2019/04/16224)    Mobility non-ambulatory     Focused Assessments Neuro Assessment Handoff:  Swallow screen pass? No          Neuro Assessment:   Neuro Checks:      Last Documented NIHSS Modified Score:   Has TPA been given? No If patient is a Neuro Trauma and patient is going to OR before floor call report to 4NSomersurse: [phone]   R Recommendations: See Admitting Provider Note  Report given to:    Additional Notes:

## 2019-04-17 NOTE — Progress Notes (Signed)
Rate increased per verbal order of MD

## 2019-04-17 NOTE — Consult Note (Signed)
Responded to Code Blue. Nurse checked, said no family present, no need for me to come personally at this time. Standing by.  Rev. Eloise Levels Chaplain

## 2019-04-17 NOTE — Progress Notes (Signed)
Pt's wife and sister arrived on unit.  Explained severity of his medical condition and concern that he will develop cardiac arrest again.  They want to continue with current therapies with the slim hope that he can fight through this, but understand he likely will not survive.  They have requested that his mother be allowed to come also, and she will be driving from [place].  Chesley Mires, MD [facility] 2019-04-26, 6:33 PM

## 2019-04-17 NOTE — Progress Notes (Signed)
Code blue called for pt @ 1724 pm. Code sheet in physical chart.

## 2019-04-17 NOTE — ED Triage Notes (Signed)
Pt brought in by [facility] with complaints of being found unresponsive. Reports pt had not been seen since 03/28/2019 @ 1600. When EMS arrived on scene patient GCS of 3 and intubation performed in the field.  EMS gave 8 of narcan and 50 Bi-carb, 500NS Only access at this time is left tib IO  Family owns large acres of land and went looking for patient. Suicide note found near pt

## 2019-04-17 NOTE — Progress Notes (Signed)
Responded to Code [employer]. Patient already intubated and active code in progress. CCM did not require further assistance from Anesthesia.  Oleta Mouse, MD April 14, 2019 5:35 PM

## 2019-04-17 NOTE — Progress Notes (Signed)
Pharmacy Antibiotic Note  Philip Garza is a 63 y.o. male admitted on 04/18/19 with concerns for aspiration pna in setting of potential overdose requiring intubation.  Pharmacy has been consulted for Zosyn dosing.  Plan: Zosyn 3.375g IV every 8 hours Monitor renal function, Cx and ability to narrow     Temp (24hrs), Avg:83.5 F (28.6 C), Min:83.5 F (28.6 C), Max:83.5 F (28.6 C)  Recent Labs  Lab 18-Apr-2019 1158  WBC 56.5*    CrCl cannot be calculated (Patient's most recent lab result is older than the maximum 21 days allowed.).    No Known Allergies  Antimicrobials this admission: Zosyn 4/12>>  Dose adjustments this admission: n/a  Microbiology results: 4/12 BCx: sent 4/12 Resp VU:DTHY  Bertis Ruddy, PharmD Clinical Pharmacist Please check AMION for all [place] numbers 04/18/2019 1:12 PM

## 2019-04-17 DEATH — deceased

## 2019-11-05 ENCOUNTER — Ambulatory Visit: Payer: BLUE CROSS/BLUE SHIELD | Admitting: Physician Assistant

## 2020-03-16 IMAGING — DX PORTABLE CHEST - 1 VIEW
1 series · 1 of 1 positions shown · non-contrast
Comparison: PET/CT and CT chest 08/19/2014. Chest x-ray 04/08/2014.

CLINICAL DATA: Intubation. Unspecified drug overdose. Current
history of hypertension, anxiety and bipolar disorder. Personal
history of prostate cancer metastatic to bone.

EXAM:
PORTABLE CHEST 1 VIEW

[chest ap]
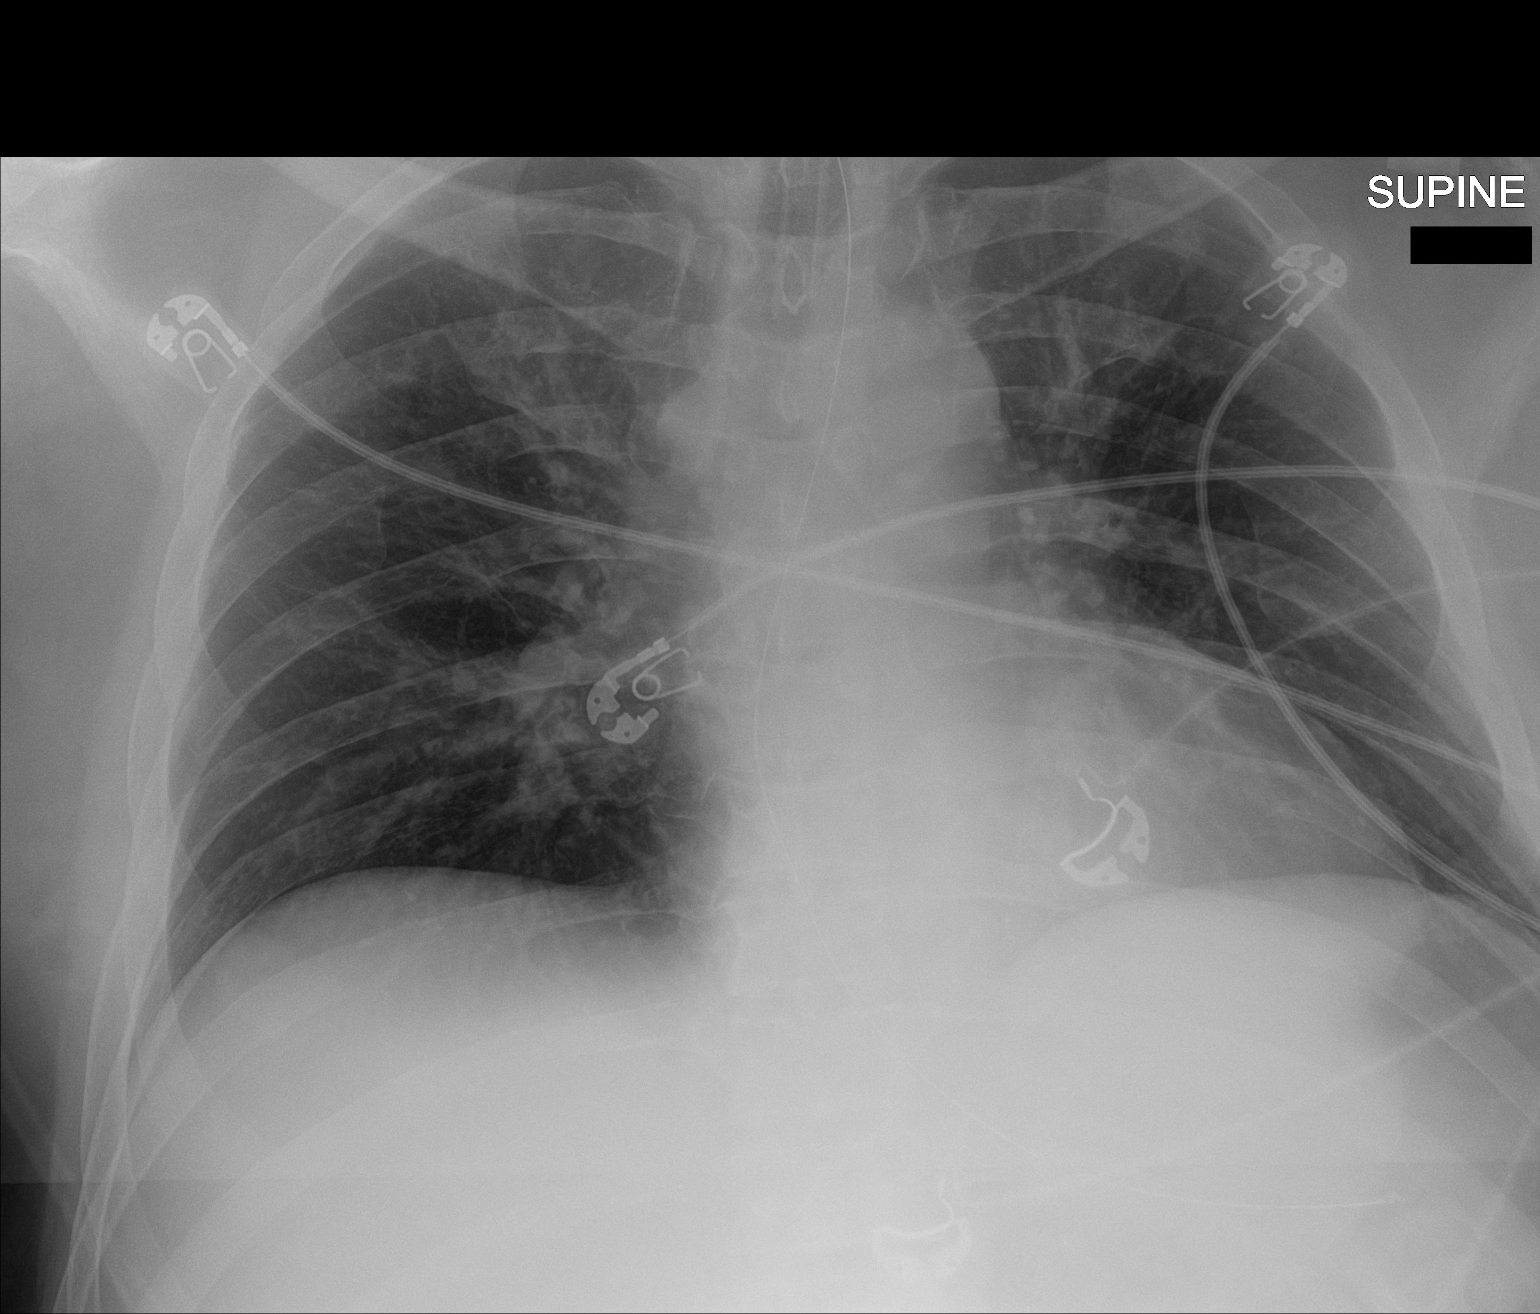

[1 of 1 positions shown; findings below may reference images not displayed]

FINDINGS: Endotracheal tube tip in satisfactory position projecting
approximately 5 cm above the carina. Gastric tube courses below the
diaphragm into the stomach with its tip in the fundus.

Cardiac silhouette mildly enlarged for AP portable technique with
interval increase in size since 7379. Mild streaky and patchy
opacities involving the RIGHT lung base. Lungs otherwise clear.
Pulmonary vascularity normal. No visible pleural effusions.
IMPRESSION: 1. Support apparatus satisfactory.
2. Bronchopneumonia versus aspiration pneumonia is suspected
involving the RIGHT lung base.
